# Patient Record
Sex: Female | Born: 1990 | Race: Black or African American | Hispanic: No | Marital: Single | Smoking: Former smoker
Health system: Southern US, Community
[De-identification: ages and names within clinical notes are randomized; demographics above are authoritative.]

## PROBLEM LIST (undated history)

## (undated) DIAGNOSIS — Z789 Other specified health status: Secondary | ICD-10-CM

## (undated) DIAGNOSIS — E119 Type 2 diabetes mellitus without complications: Secondary | ICD-10-CM

## (undated) DIAGNOSIS — D649 Anemia, unspecified: Secondary | ICD-10-CM

## (undated) HISTORY — DX: Type 2 diabetes mellitus without complications: E11.9

## (undated) HISTORY — PX: NO PAST SURGERIES: SHX2092

---
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
## 2011-02-28 LAB — POCT URINALYSIS DIP (DEVICE)
Hgb urine dipstick: NEGATIVE
Ketones, ur: NEGATIVE mg/dL
Ketones, ur: NEGATIVE mg/dL
Nitrite: NEGATIVE
Protein, ur: NEGATIVE mg/dL
Protein, ur: NEGATIVE mg/dL
Specific Gravity, Urine: <=1.005 (ref 1.005–1.030)
Urobilinogen, UA: 0.2 mg/dL (ref 0.0–1.0)
pH: 6 (ref 5.0–8.0)

## 2011-02-28 LAB — CBC
Hemoglobin: 11.4 g/dL — ABNORMAL LOW (ref 12.0–16.0)
MCHC: 33.6 g/dL (ref 31.0–37.0)
RDW: 14.7 % (ref 11.4–15.5)

## 2011-03-01 LAB — DIFFERENTIAL
Basophils Absolute: 0 10*3/uL (ref 0.0–0.1)
Lymphocytes Relative: 21 % — ABNORMAL LOW (ref 24–48)
Monocytes Relative: 10 % (ref 3–11)
Neutrophils Relative %: 68 % (ref 43–71)

## 2011-03-01 LAB — COMPREHENSIVE METABOLIC PANEL
ALT: 22 U/L (ref 0–35)
AST: 25 U/L (ref 0–37)
Albumin: 3 g/dL — ABNORMAL LOW (ref 3.5–5.2)
Alkaline Phosphatase: 139 U/L — ABNORMAL HIGH (ref 47–119)
BUN: 7 mg/dL (ref 6–23)
CO2: 23 mEq/L (ref 19–32)
Creatinine, Ser: 0.68 mg/dL (ref 0.4–1.2)
Sodium: 137 mEq/L (ref 135–145)
Total Bilirubin: 1.1 mg/dL (ref 0.3–1.2)

## 2011-03-01 LAB — STREP B DNA PROBE: Strep Group B Ag: NEGATIVE

## 2011-03-01 LAB — CBC
MCV: 90.2 fL (ref 78.0–98.0)
RBC: 3.75 MIL/uL — ABNORMAL LOW (ref 3.80–5.70)
RDW: 14.5 % (ref 11.4–15.5)
WBC: 7.4 10*3/uL (ref 4.5–13.5)

## 2011-03-01 LAB — WET PREP, GENITAL
Trich, Wet Prep: NONE SEEN
Yeast Wet Prep HPF POC: NONE SEEN

## 2011-03-01 LAB — HIV ANTIBODY (ROUTINE TESTING W REFLEX): HIV: NONREACTIVE

## 2012-08-22 ENCOUNTER — Observation Stay (HOSPITAL_COMMUNITY)
Admission: EM | Admit: 2012-08-22 | Discharge: 2012-08-23 | Disposition: A | Discharge status: Home / Self Care | Payer: Self-pay | Attending: Internal Medicine | Admitting: Internal Medicine

## 2012-08-22 ENCOUNTER — Emergency Department (HOSPITAL_COMMUNITY): Payer: Self-pay

## 2012-08-22 ENCOUNTER — Encounter (HOSPITAL_COMMUNITY): Payer: Self-pay | Admitting: Emergency Medicine

## 2012-08-22 DIAGNOSIS — Z720 Tobacco use: Secondary | ICD-10-CM

## 2012-08-22 DIAGNOSIS — R509 Fever, unspecified: Secondary | ICD-10-CM | POA: Insufficient documentation

## 2012-08-22 DIAGNOSIS — J209 Acute bronchitis, unspecified: Secondary | ICD-10-CM | POA: Insufficient documentation

## 2012-08-22 DIAGNOSIS — R0602 Shortness of breath: Principal | ICD-10-CM | POA: Insufficient documentation

## 2012-08-22 DIAGNOSIS — F17200 Nicotine dependence, unspecified, uncomplicated: Secondary | ICD-10-CM | POA: Insufficient documentation

## 2012-08-22 LAB — CBC WITH DIFFERENTIAL/PLATELET
Eosinophils Relative: 3 % (ref 0–5)
MCHC: 34.3 g/dL (ref 30.0–36.0)
MCV: 88.2 fL (ref 78.0–100.0)
Monocytes Absolute: 0.5 10*3/uL (ref 0.1–1.0)
Monocytes Relative: 5 % (ref 3–12)
Neutrophils Relative %: 80 % — ABNORMAL HIGH (ref 43–77)
Platelets: 306 10*3/uL (ref 150–400)
RBC: 4.5 MIL/uL (ref 3.87–5.11)
RDW: 12 % (ref 11.5–15.5)
WBC: 10.9 10*3/uL — ABNORMAL HIGH (ref 4.0–10.5)

## 2012-08-22 LAB — BASIC METABOLIC PANEL
BUN: 8 mg/dL (ref 6–23)
Calcium: 9.3 mg/dL (ref 8.4–10.5)
Creatinine, Ser: 0.71 mg/dL (ref 0.50–1.10)
Glucose, Bld: 130 mg/dL — ABNORMAL HIGH (ref 70–99)

## 2012-08-22 LAB — PREGNANCY, URINE: Preg Test, Ur: NEGATIVE

## 2012-08-22 MED ORDER — SODIUM CHLORIDE 0.9 % IV BOLUS (SEPSIS)
1000.0000 mL | Freq: Once | INTRAVENOUS | Status: AC
  Administered 2012-08-22: 1000 mL via INTRAVENOUS

## 2012-08-22 MED ORDER — ALBUTEROL SULFATE (5 MG/ML) 0.5% IN NEBU
5.0000 mg | INHALATION_SOLUTION | Freq: Once | RESPIRATORY_TRACT | Status: AC
  Administered 2012-08-22: 5 mg via RESPIRATORY_TRACT
  Filled 2012-08-22: qty 1

## 2012-08-22 MED ORDER — ONDANSETRON HCL 4 MG PO TABS: 4.0000 mg | ORAL_TABLET | Freq: Four times a day (QID) | ORAL | Status: DC | PRN

## 2012-08-22 MED ORDER — IPRATROPIUM BROMIDE 0.02 % IN SOLN
0.5000 mg | Freq: Once | RESPIRATORY_TRACT | Status: AC
  Administered 2012-08-22: 0.5 mg via RESPIRATORY_TRACT
  Filled 2012-08-22: qty 2.5

## 2012-08-22 MED ORDER — METHYLPREDNISOLONE SODIUM SUCC 125 MG IJ SOLR
125.0000 mg | Freq: Once | INTRAMUSCULAR | Status: AC
  Administered 2012-08-22: 125 mg via INTRAVENOUS
  Filled 2012-08-22: qty 2

## 2012-08-22 MED ORDER — ACETAMINOPHEN 325 MG PO TABS: 650.0000 mg | ORAL_TABLET | Freq: Four times a day (QID) | ORAL | Status: DC | PRN

## 2012-08-22 MED ORDER — SODIUM CHLORIDE 0.9 % IV SOLN
INTRAVENOUS | Status: DC
  Administered 2012-08-22 – 2012-08-23 (×2): via INTRAVENOUS

## 2012-08-22 MED ORDER — OXYCODONE HCL 5 MG PO TABS
5.0000 mg | ORAL_TABLET | ORAL | Status: DC | PRN
  Administered 2012-08-22 (×2): 5 mg via ORAL
  Filled 2012-08-22 (×2): qty 1

## 2012-08-22 MED ORDER — ACETAMINOPHEN 325 MG PO TABS
650.0000 mg | ORAL_TABLET | Freq: Once | ORAL | Status: AC
  Administered 2012-08-22: 650 mg via ORAL
  Filled 2012-08-22: qty 2

## 2012-08-22 MED ORDER — PREDNISONE 50 MG PO TABS
60.0000 mg | ORAL_TABLET | ORAL | Status: AC
  Administered 2012-08-22: 60 mg via ORAL
  Filled 2012-08-22: qty 1

## 2012-08-22 MED ORDER — ENOXAPARIN SODIUM 40 MG/0.4ML ~~LOC~~ SOLN
40.0000 mg | SUBCUTANEOUS | Status: DC
  Administered 2012-08-22: 40 mg via SUBCUTANEOUS
  Filled 2012-08-22 (×2): qty 0.4

## 2012-08-22 MED ORDER — ACETAMINOPHEN 650 MG RE SUPP: 650.0000 mg | Freq: Four times a day (QID) | RECTAL | Status: DC | PRN

## 2012-08-22 MED ORDER — ALBUTEROL SULFATE (5 MG/ML) 0.5% IN NEBU: 2.5000 mg | INHALATION_SOLUTION | RESPIRATORY_TRACT | Status: DC | PRN

## 2012-08-22 MED ORDER — SENNOSIDES-DOCUSATE SODIUM 8.6-50 MG PO TABS
1.0000 | ORAL_TABLET | Freq: Every evening | ORAL | Status: DC | PRN
  Filled 2012-08-22: qty 1

## 2012-08-22 MED ORDER — ALBUTEROL SULFATE (5 MG/ML) 0.5% IN NEBU
2.5000 mg | INHALATION_SOLUTION | Freq: Four times a day (QID) | RESPIRATORY_TRACT | Status: DC
  Administered 2012-08-22 – 2012-08-23 (×4): 2.5 mg via RESPIRATORY_TRACT
  Filled 2012-08-22 (×4): qty 0.5

## 2012-08-22 MED ORDER — LEVOFLOXACIN IN D5W 500 MG/100ML IV SOLN
500.0000 mg | INTRAVENOUS | Status: DC
  Administered 2012-08-22: 500 mg via INTRAVENOUS
  Filled 2012-08-22 (×2): qty 100

## 2012-08-22 MED ORDER — ONDANSETRON HCL 4 MG/2ML IJ SOLN: 4.0000 mg | Freq: Four times a day (QID) | INTRAMUSCULAR | Status: DC | PRN

## 2012-08-22 MED ORDER — ALBUTEROL (5 MG/ML) CONTINUOUS INHALATION SOLN
10.0000 mg/h | INHALATION_SOLUTION | Freq: Once | RESPIRATORY_TRACT | Status: AC
  Administered 2012-08-22: 10 mg/h via RESPIRATORY_TRACT

## 2012-08-22 MED ORDER — PNEUMOCOCCAL VAC POLYVALENT 25 MCG/0.5ML IJ INJ: 0.5000 mL | INJECTION | INTRAMUSCULAR | Status: DC

## 2012-08-22 NOTE — ED Notes (Signed)
Pt c/o upper chest and back pain with shob and cough onset yesterday, febrile in triage

## 2012-08-22 NOTE — H&P (Signed)
[facility]          History and Physical    PCP:   No primary provider on file.   Chief Complaint:  SOB, wheezing.  HPI: Pleasant 21 y/o young lady without significant PMH other than tobacco abuse, who presents to the hospital with a 1 day h/o SOB and wheezing. Had been having URI symptoms (runny nose, low grade fever) for a few days. Went to work and developed SOB and some mild anterior wall chest pains. Because these persisted, she came to the hospital today. She was found to have a temp of 102.7 and wheezing that did not resolve with neb treatments in the ED, hence we have been asked to admit her for further evaluation and management. Says she smokes only while at work because "it stresses me out". She only works weekends, so she states a pack a cigarettes will last her 2 weeks.  Allergies:  No Known Allergies   History reviewed. No pertinent past medical history.  History reviewed. No pertinent past surgical history.  Prior to Admission medications   Medication Sig Start Date End Date Taking? Authorizing Provider  etonogestrel (IMPLANON) 68 MG IMPL implant Inject 1 each into the skin once.   Yes Historical Provider, MD  Pseudoephedrine-APAP-DM (DAYQUIL PO) Take 2 tablets by mouth every 6 (six) hours as needed. COLD SYMPTOMS   Yes Historical Provider, MD    Social History:  reports that she has been smoking.  She does not have any smokeless tobacco history on file. She reports that she does not drink alcohol or use illicit drugs.  No family history on file.  Review of Systems:  Constitutional: Denies  Diaphoresis. HEENT: Denies photophobia, eye pain, redness, hearing loss, ear pain, congestion, sore throat, rhinorrhea, sneezing, mouth sores, trouble swallowing, neck pain, neck stiffness and tinnitus.   Cardiovascular: Denies  palpitations and leg swelling.  Gastrointestinal: Denies nausea, vomiting, abdominal pain, diarrhea, constipation, blood in stool and  abdominal distention.  Genitourinary: Denies dysuria, urgency, frequency, hematuria, flank pain and difficulty urinating.  Musculoskeletal: Denies myalgias, back pain, joint swelling, arthralgias and gait problem.  Skin: Denies pallor, rash and wound.  Neurological: Denies dizziness, seizures, syncope, weakness, light-headedness, numbness and headaches.  Hematological: Denies adenopathy. Easy bruising, personal or family bleeding history  Psychiatric/Behavioral: Denies suicidal ideation, mood changes, confusion, nervousness, sleep disturbance and agitation   Physical Exam: Blood pressure 110/70, pulse 125, temperature 98.2 F (36.8 C), temperature source Oral, resp. rate 21, height 5\' 6"  (1.676 m), weight 95.2 kg (209 lb 14.1 oz), last menstrual period 08/10/2012, SpO2 100.00%. Gen: AAOx3, NAD HEENT: [place]/AT/PERRL/EOMI Neck: supple, no JVD, no LAD, no bruits, no goiter. CV: Tachycardic, regular, no M/R/G. Lungs: CTA B Abd: S/NT/ND/+BS/no masses or organomegaly noted. Ext: no C/C/E, +pedal pulses. Neuro: grossly intact and non-focal. Skin: no rashes, wounds or skin breakdown identified on exam. Labs on Admission:  Results for orders placed during the hospital encounter of 08/22/12 (from the past 48 hour(s))  CBC WITH DIFFERENTIAL     Status: Abnormal   Collection Time   08/22/12  3:45 AM      Component Value Range Comment   WBC 10.9 (*) 4.0 - 10.5 K/uL    RBC 4.50  3.87 - 5.11 MIL/uL    Hemoglobin 13.6  12.0 - 15.0 g/dL    HCT 16.1  09.6 - 04.5 %    MCV 88.2  78.0 - 100.0 fL    MCH 30.2  26.0 - 34.0 pg  MCHC 34.3  30.0 - 36.0 g/dL    RDW 16.1  09.6 - 04.5 %    Platelets 306  150 - 400 K/uL    Neutrophils Relative 80 (*) 43 - 77 %    Lymphocytes Relative 12  12 - 46 %    Monocytes Relative 5  3 - 12 %    Eosinophils Relative 3  0 - 5 %    Basophils Relative 0  0 - 1 %    Neutro Abs 8.8 (*) 1.7 - 7.7 K/uL    Lymphs Abs 1.3  0.7 - 4.0 K/uL    Monocytes Absolute 0.5  0.1 - 1.0  K/uL    Eosinophils Absolute 0.3  0.0 - 0.7 K/uL    Basophils Absolute 0.0  0.0 - 0.1 K/uL    WBC Morphology VACUOLATED NEUTROPHILS   DOHLE BODIES  BASIC METABOLIC PANEL     Status: Abnormal   Collection Time   08/22/12  3:45 AM      Component Value Range Comment   Sodium 136  135 - 145 mEq/L    Potassium 4.1  3.5 - 5.1 mEq/L    Chloride 101  96 - 112 mEq/L    CO2 24  19 - 32 mEq/L    Glucose, Bld 130 (*) 70 - 99 mg/dL    BUN 8  6 - 23 mg/dL    Creatinine, Ser 4.09  0.50 - 1.10 mg/dL    Calcium 9.3  8.4 - 81.1 mg/dL    GFR calc non Af Amer >90  >90 mL/min    GFR calc Af Amer >90  >90 mL/min   PREGNANCY, URINE     Status: Normal   Collection Time   08/22/12  4:08 AM      Component Value Range Comment   Preg Test, Ur NEGATIVE  NEGATIVE     Radiological Exams on Admission: Dg Chest 2 View  08/22/2012  *RADIOLOGY REPORT*  Clinical Data: Occasional smoker, cough  CHEST - 2 VIEW  Comparison: None.  Findings: Normal mediastinum and cardiac silhouette.  Normal pulmonary  vasculature.  No evidence of effusion, infiltrate, or pneumothorax.  No acute bony abnormality.  IMPRESSION: No acute cardiopulmonary process.   Original Report Authenticated By: Genevive Bi, M.D.     Assessment/Plan Principal Problem:  *Bronchitis, acute, with bronchospasm Active Problems:  Fever  Tobacco abuse   Acute Bronchitis with Bronchospasm -Has a fever with cough and wheezing. -No infiltrate noted on CXR. -Temp of 102.7 on admission. -Start on levaquin/albuterol/prednisone taper.  Tobacco Abuse -Smoking cessation counseling provided. -Does not want a nicotine patch. -"I can quit on my own".  DVT Prophylaxis -Lovenox.  Disposition -Plan for potential DC home in am as long as symptomatically improved, not hypoxic and afebrile.  Time Spent on Admission: 55 minutes  HERNANDEZ ACOSTA,ESTELA [facility] Pager: [phone] 08/22/2012, 10:14 AM

## 2012-08-22 NOTE — ED Notes (Signed)
Patient is alert and oriented x3.  She has a states her pain is 8 of 10. She does have an expiratory wheeze.  She denies having this issue before.

## 2012-08-22 NOTE — ED Provider Notes (Signed)
Medical screening examination/treatment/procedure(s) were performed by non-physician practitioner and as supervising physician I was immediately available for consultation/collaboration.   Ivylynn Hoppes, MD 08/22/12 0713 

## 2012-08-22 NOTE — ED Provider Notes (Signed)
History     CSN: [id number]  Arrival date & time 08/22/12  0018   First MD Initiated Contact with Patient 08/22/12 [phone]      Chief Complaint  Patient presents with  . Shortness of Breath   HPI  History provided by the patient. Patient is a 21 year old female who is a current smoker presents with complaints of fever, cough and shortness of breath symptoms. Patient reports having generalized fatigue and not feeling well yesterday which was followed by some fever and warmth. Patient also began developing a productive cough, chest tightness and shortness of breath symptoms. Patient did try taking some DayQuil without any relief of symptoms. Patient reports significantly increased shortness of breath symptoms tonight which is made it hard to rest or sleep at all. Patient denies having similar symptoms previously. She denies any recent travel. She denies any sick contacts.   History reviewed. No pertinent past medical history.  History reviewed. No pertinent past surgical history.  No family history on file.  History  Substance Use Topics  . Smoking status: Current Every Day Smoker  . Smokeless tobacco: Not on file  . Alcohol Use: No    OB History    Grav Para Term Preterm Abortions TAB SAB Ect Mult Living                  Review of Systems  Constitutional: Positive for fever, chills and fatigue.  HENT: Positive for rhinorrhea. Negative for congestion and sore throat.   Respiratory: Positive for cough, shortness of breath and wheezing.   Cardiovascular: Negative for chest pain.  Gastrointestinal: Negative for nausea, vomiting, abdominal pain and diarrhea.  Genitourinary: Negative for dysuria, frequency, hematuria and flank pain.  Skin: Negative for rash.  Neurological: Positive for headaches. Negative for light-headedness.    Allergies  Review of patient's allergies indicates no known allergies.  Home Medications   Current Outpatient Rx  Name Route Sig Dispense Refill    . ETONOGESTREL 68 MG  IMPL Subcutaneous Inject 1 each into the skin once.    . DAYQUIL PO Oral Take 2 tablets by mouth every 6 (six) hours as needed. COLD SYMPTOMS      BP 116/68  Pulse 121  Temp 102.8 F (39.3 C) (Oral)  Resp 26  Ht 5\' 6"  (1.676 m)  Wt 210 lb (95.255 kg)  BMI 33.89 kg/m2  SpO2 97%  LMP 08/10/2012  Physical Exam  Nursing note and vitals reviewed. Constitutional: She is oriented to person, place, and time. She appears well-developed and well-nourished. No distress.  HENT:  Head: Normocephalic.  Neck: Normal range of motion. Neck supple.       No meningeal signs  Cardiovascular: Regular rhythm.  Tachycardia present.   Pulmonary/Chest: Tachypnea noted. She has wheezes. She has no rales. She exhibits no tenderness.  Abdominal: Soft. There is no tenderness. There is no rebound and no guarding.       Obese  Neurological: She is alert and oriented to person, place, and time.  Skin: Skin is warm and dry. No rash noted.  Psychiatric: She has a normal mood and affect. Her behavior is normal.    ED Course  Procedures   Results for orders placed during the hospital encounter of 08/22/12  CBC WITH DIFFERENTIAL      Component Value Range   WBC PENDING  4.0 - 10.5 K/uL   RBC 4.50  3.87 - 5.11 MIL/uL   Hemoglobin 13.6  12.0 - 15.0 g/dL   HCT  39.7  36.0 - 46.0 %   MCV 88.2  78.0 - 100.0 fL   MCH 30.2  26.0 - 34.0 pg   MCHC 34.3  30.0 - 36.0 g/dL   RDW 13.0  86.5 - 78.4 %   Platelets PENDING  150 - 400 K/uL   Neutrophils Relative PENDING  43 - 77 %   Neutro Abs PENDING  1.7 - 7.7 K/uL   Band Neutrophils PENDING  0 - 10 %   Lymphocytes Relative PENDING  12 - 46 %   Lymphs Abs PENDING  0.7 - 4.0 K/uL   Monocytes Relative PENDING  3 - 12 %   Monocytes Absolute PENDING  0.1 - 1.0 K/uL   Eosinophils Relative PENDING  0 - 5 %   Eosinophils Absolute PENDING  0.0 - 0.7 K/uL   Basophils Relative PENDING  0 - 1 %   Basophils Absolute PENDING  0.0 - 0.1 K/uL   WBC  Morphology PENDING     RBC Morphology PENDING     Smear Review PENDING     nRBC PENDING  0 /100 WBC   Metamyelocytes Relative PENDING     Myelocytes PENDING     Promyelocytes Absolute PENDING     Blasts PENDING    BASIC METABOLIC PANEL      Component Value Range   Sodium 136  135 - 145 mEq/L   Potassium 4.1  3.5 - 5.1 mEq/L   Chloride 101  96 - 112 mEq/L   CO2 24  19 - 32 mEq/L   Glucose, Bld 130 (*) 70 - 99 mg/dL   BUN 8  6 - 23 mg/dL   Creatinine, Ser 6.96  0.50 - 1.10 mg/dL   Calcium 9.3  8.4 - 29.5 mg/dL   GFR calc non Af Amer >90  >90 mL/min   GFR calc Af Amer >90  >90 mL/min  PREGNANCY, URINE      Component Value Range   Preg Test, Ur NEGATIVE  NEGATIVE      Dg Chest 2 View  08/22/2012  *RADIOLOGY REPORT*  Clinical Data: Occasional smoker, cough  CHEST - 2 VIEW  Comparison: None.  Findings: Normal mediastinum and cardiac silhouette.  Normal pulmonary  vasculature.  No evidence of effusion, infiltrate, or pneumothorax.  No acute bony abnormality.  IMPRESSION: No acute cardiopulmonary process.   Original Report Authenticated By: Genevive Bi, M.D.      1. Bronchitis with bronchospasm       MDM  Patient seen and evaluated. Patient currently tachycardic with slight tachypnea. Significant diffuse wheezing throughout. Chest x-ray unremarkable.   Patient reports feeling slightly better after our long neb treatment. She still has significant wheezing on exam. Heart rate is still tachycardic. Solu-Medrol also given.  Patient given additional breathing treatments.  Patient discussed with attending physician. Patient continues to have significant wheezing. We'll plan for admission.   [facility]. They'll see patient and admit.      Angus Seller, PA 08/22/12 0630

## 2012-08-23 ENCOUNTER — Other Ambulatory Visit (HOSPITAL_COMMUNITY): Payer: Self-pay | Admitting: Respiratory Therapy

## 2012-08-23 MED ORDER — ALBUTEROL SULFATE HFA 108 (90 BASE) MCG/ACT IN AERS: 2.0000 | INHALATION_SPRAY | Freq: Four times a day (QID) | RESPIRATORY_TRACT | Status: DC | PRN

## 2012-08-23 MED ORDER — PREDNISONE 10 MG PO KIT: PACK | ORAL | Status: DC

## 2012-08-23 MED ORDER — ACETAMINOPHEN 325 MG PO TABS: 650.0000 mg | ORAL_TABLET | Freq: Four times a day (QID) | ORAL | Status: DC | PRN

## 2012-08-23 MED ORDER — LEVOFLOXACIN 500 MG PO TABS: 500.0000 mg | ORAL_TABLET | Freq: Every day | ORAL | Status: DC

## 2012-08-23 NOTE — Progress Notes (Signed)
Discharge instructions given to pt, verbalized understanding. Left the unit in stable condition. 

## 2012-08-23 NOTE — Discharge Summary (Signed)
Physician Discharge Summary  Patient ID: [name] [name] MRN: [id number] DOB/AGE: December 05, 1990 21 y.o.  Admit date: 08/22/2012 Discharge date: 08/23/2012  Primary Care Physician:  No primary provider on file.   Discharge Diagnoses:    Principal Problem:  *Bronchitis, acute, with bronchospasm Active Problems:  Fever  Tobacco abuse      Medication List     As of 08/23/2012 12:44 PM    STOP taking these medications         DAYQUIL PO      TAKE these medications         acetaminophen 325 MG tablet   Commonly known as: TYLENOL   Take 2 tablets (650 mg total) by mouth every 6 (six) hours as needed (or Fever >/= 101).      albuterol 108 (90 BASE) MCG/ACT inhaler   Commonly known as: PROVENTIL HFA;VENTOLIN HFA   Inhale 2 puffs into the lungs every 6 (six) hours as needed for wheezing.      IMPLANON 68 MG Impl implant   Generic drug: etonogestrel   Inject 1 each into the skin once.      levofloxacin 500 MG tablet   Commonly known as: LEVAQUIN   Take 1 tablet (500 mg total) by mouth daily.      PredniSONE 10 MG Kit   Start taking 6 pills today and decrease by 1 pill daily until no more are left in the pack.         Disposition and Follow-up:  Will be discharged home today in stable and improved condition.   Consults:  None   Significant Diagnostic Studies:  Dg Chest 2 View  08/22/2012  *RADIOLOGY REPORT*  Clinical Data: Occasional smoker, cough  CHEST - 2 VIEW  Comparison: None.  Findings: Normal mediastinum and cardiac silhouette.  Normal pulmonary  vasculature.  No evidence of effusion, infiltrate, or pneumothorax.  No acute bony abnormality.  IMPRESSION: No acute cardiopulmonary process.   Original Report Authenticated By: Genevive Bi, M.D.     Brief H and P: For complete details please refer to admission H and P, but in brief patient is a pleasant 21 y/o young lady without significant PMH other than tobacco abuse, who presents to the hospital with a 1 day h/o  SOB and wheezing. Had been having URI symptoms (runny nose, low grade fever) for a few days. Went to work and developed SOB and some mild anterior wall chest pains. Because these persisted, she came to the hospital today. She was found to have a temp of 102.7 and wheezing that did not resolve with neb treatments in the ED, hence we have been asked to admit her for further evaluation and management. Says she smokes only while at work because "it stresses me out". She only works weekends, so she states a pack a cigarettes will last her 2 weeks.     Hospital Course:  Principal Problem:  *Bronchitis, acute, with bronchospasm Active Problems:  Fever  Tobacco abuse    Acute Bronchitis with Bronchospasm -CXR without indication for PNA. -Improved today. No wheezing on chest auscultation. -Has been afebrile for >24 hours. -Will discharge home on a 7 day course of levaquin, prednisone taper and PRN albuterol inhaler. -She has been advised on smoking cessation and says she does not need the aid of patches to quit.   Time spent on Discharge: Greater than 30 minutes.  SignedChaya Jan [facility] Pager: [phone] 08/23/2012, 12:44 PM

## 2013-01-08 ENCOUNTER — Other Ambulatory Visit: Payer: Self-pay

## 2013-09-29 ENCOUNTER — Other Ambulatory Visit: Payer: Self-pay

## 2014-07-25 ENCOUNTER — Encounter (HOSPITAL_COMMUNITY): Payer: Self-pay | Admitting: Emergency Medicine

## 2014-07-25 ENCOUNTER — Emergency Department (HOSPITAL_COMMUNITY)
Admission: EM | Admit: 2014-07-25 | Discharge: 2014-07-26 | Disposition: A | Discharge status: Home / Self Care | Payer: Medicaid Other | Attending: Emergency Medicine | Admitting: Emergency Medicine

## 2014-07-25 ENCOUNTER — Emergency Department (HOSPITAL_COMMUNITY): Payer: Medicaid Other

## 2014-07-25 DIAGNOSIS — R112 Nausea with vomiting, unspecified: Secondary | ICD-10-CM | POA: Insufficient documentation

## 2014-07-25 DIAGNOSIS — Z79899 Other long term (current) drug therapy: Secondary | ICD-10-CM | POA: Diagnosis not present

## 2014-07-25 DIAGNOSIS — M549 Dorsalgia, unspecified: Secondary | ICD-10-CM | POA: Insufficient documentation

## 2014-07-25 DIAGNOSIS — F17200 Nicotine dependence, unspecified, uncomplicated: Secondary | ICD-10-CM | POA: Insufficient documentation

## 2014-07-25 DIAGNOSIS — N898 Other specified noninflammatory disorders of vagina: Secondary | ICD-10-CM | POA: Diagnosis present

## 2014-07-25 DIAGNOSIS — Z3202 Encounter for pregnancy test, result negative: Secondary | ICD-10-CM | POA: Diagnosis not present

## 2014-07-25 DIAGNOSIS — B9689 Other specified bacterial agents as the cause of diseases classified elsewhere: Secondary | ICD-10-CM

## 2014-07-25 DIAGNOSIS — N760 Acute vaginitis: Secondary | ICD-10-CM | POA: Insufficient documentation

## 2014-07-25 DIAGNOSIS — A499 Bacterial infection, unspecified: Secondary | ICD-10-CM | POA: Insufficient documentation

## 2014-07-25 DIAGNOSIS — A599 Trichomoniasis, unspecified: Secondary | ICD-10-CM

## 2014-07-25 DIAGNOSIS — A5901 Trichomonal vulvovaginitis: Secondary | ICD-10-CM | POA: Diagnosis not present

## 2014-07-25 LAB — URINALYSIS, ROUTINE W REFLEX MICROSCOPIC
BILIRUBIN URINE: NEGATIVE
Glucose, UA: NEGATIVE mg/dL
KETONES UR: NEGATIVE mg/dL
NITRITE: NEGATIVE
PROTEIN: NEGATIVE mg/dL
Specific Gravity, Urine: 1.013 (ref 1.005–1.030)
UROBILINOGEN UA: 1 mg/dL (ref 0.0–1.0)
pH: 6 (ref 5.0–8.0)

## 2014-07-25 LAB — WET PREP, GENITAL: YEAST WET PREP: NONE SEEN

## 2014-07-25 LAB — BASIC METABOLIC PANEL
ANION GAP: 15 (ref 5–15)
BUN: 7 mg/dL (ref 6–23)
CHLORIDE: 96 meq/L (ref 96–112)
CO2: 24 meq/L (ref 19–32)
CREATININE: 0.88 mg/dL (ref 0.50–1.10)
Calcium: 8.3 mg/dL — ABNORMAL LOW (ref 8.4–10.5)
GFR calc Af Amer: >90 mL/min (ref >90)
GFR calc non Af Amer: >90 mL/min (ref >90)
Glucose, Bld: 100 mg/dL — ABNORMAL HIGH (ref 70–99)
Potassium: 3.2 mEq/L — ABNORMAL LOW (ref 3.7–5.3)
Sodium: 135 mEq/L — ABNORMAL LOW (ref 137–147)

## 2014-07-25 LAB — CBC WITH DIFFERENTIAL/PLATELET
BASOS ABS: 0 10*3/uL (ref 0.0–0.1)
Basophils Relative: 0 % (ref 0–1)
Eosinophils Absolute: 0 10*3/uL (ref 0.0–0.7)
Eosinophils Relative: 0 % (ref 0–5)
HEMATOCRIT: 38.8 % (ref 36.0–46.0)
HEMOGLOBIN: 13 g/dL (ref 12.0–15.0)
LYMPHS ABS: 1.1 10*3/uL (ref 0.7–4.0)
LYMPHS PCT: 24 % (ref 12–46)
MCH: 30.7 pg (ref 26.0–34.0)
MCHC: 33.5 g/dL (ref 30.0–36.0)
MCV: 91.5 fL (ref 78.0–100.0)
MONO ABS: 0.3 10*3/uL (ref 0.1–1.0)
Monocytes Relative: 7 % (ref 3–12)
NEUTROS ABS: 3.2 10*3/uL (ref 1.7–7.7)
Neutrophils Relative %: 69 % (ref 43–77)
Platelets: 163 10*3/uL (ref 150–400)
RBC: 4.24 MIL/uL (ref 3.87–5.11)
RDW: 11.6 % (ref 11.5–15.5)
WBC: 4.6 10*3/uL (ref 4.0–10.5)

## 2014-07-25 LAB — URINE MICROSCOPIC-ADD ON

## 2014-07-25 LAB — POC URINE PREG, ED: Preg Test, Ur: NEGATIVE

## 2014-07-25 MED ORDER — METRONIDAZOLE 500 MG PO TABS: 500.0000 mg | ORAL_TABLET | Freq: Two times a day (BID) | ORAL | Status: DC

## 2014-07-25 MED ORDER — ONDANSETRON HCL 4 MG/2ML IJ SOLN
4.0000 mg | Freq: Once | INTRAMUSCULAR | Status: AC
Start: 2014-07-25 — End: 2014-07-25
  Administered 2014-07-25: 4 mg via INTRAVENOUS
  Filled 2014-07-25: qty 2

## 2014-07-25 MED ORDER — SODIUM CHLORIDE 0.9 % IV BOLUS (SEPSIS)
1000.0000 mL | Freq: Once | INTRAVENOUS | Status: AC
  Administered 2014-07-25: 1000 mL via INTRAVENOUS

## 2014-07-25 MED ORDER — DEXTROSE 5 % IV SOLN
1.0000 g | Freq: Once | INTRAVENOUS | Status: AC
  Administered 2014-07-25: 1 g via INTRAVENOUS
  Filled 2014-07-25: qty 10

## 2014-07-25 MED ORDER — AZITHROMYCIN 250 MG PO TABS
1000.0000 mg | ORAL_TABLET | Freq: Once | ORAL | Status: AC
  Administered 2014-07-25: 1000 mg via ORAL
  Filled 2014-07-25: qty 4

## 2014-07-25 MED ORDER — MORPHINE SULFATE 4 MG/ML IJ SOLN
4.0000 mg | Freq: Once | INTRAMUSCULAR | Status: AC
  Administered 2014-07-25: 4 mg via INTRAVENOUS
  Filled 2014-07-25: qty 1

## 2014-07-25 MED ORDER — ACETAMINOPHEN 325 MG PO TABS
650.0000 mg | ORAL_TABLET | Freq: Once | ORAL | Status: AC
  Administered 2014-07-25: 650 mg via ORAL
  Filled 2014-07-25: qty 2

## 2014-07-25 MED ORDER — ONDANSETRON 4 MG PO TBDP: 4.0000 mg | ORAL_TABLET | Freq: Three times a day (TID) | ORAL | Status: DC | PRN

## 2014-07-25 MED ORDER — TRAMADOL HCL 50 MG PO TABS: 50.0000 mg | ORAL_TABLET | Freq: Four times a day (QID) | ORAL | Status: DC | PRN

## 2014-07-25 MED ORDER — IBUPROFEN 800 MG PO TABS
800.0000 mg | ORAL_TABLET | Freq: Once | ORAL | Status: AC
  Administered 2014-07-25: 800 mg via ORAL
  Filled 2014-07-25: qty 1

## 2014-07-25 MED ORDER — DOXYCYCLINE HYCLATE 100 MG PO CAPS: 100.0000 mg | ORAL_CAPSULE | Freq: Two times a day (BID) | ORAL | Status: DC

## 2014-07-25 NOTE — ED Notes (Addendum)
Pt presents with mid back pain worse when straining for a BM onset 4 days ago- pt reports generalized body aches and headaches as well, denies changes in vision.  Admits to N/V, denies diarrhea.  Pt reports vaginal discharge for the past 3 days white in color, denies pain with urination.

## 2014-07-25 NOTE — Discharge Instructions (Signed)
Please follow up with an Ob/Gyn to schedule a follow up appointment. Please take your antibiotic until completion. Please take pain medication and/or muscle relaxants as prescribed and as needed for pain. Please do not drive on narcotic pain medication or on muscle relaxants. Please read all discharge instructions and return precautions.    Bacterial Vaginosis Bacterial vaginosis is a vaginal infection that occurs when the normal balance of bacteria in the vagina is disrupted. It results from an overgrowth of certain bacteria. This is the most common vaginal infection in women of childbearing age. Treatment is important to prevent complications, especially in pregnant women, as it can cause a premature delivery. CAUSES  Bacterial vaginosis is caused by an increase in harmful bacteria that are normally present in smaller amounts in the vagina. Several different kinds of bacteria can cause bacterial vaginosis. However, the reason that the condition develops is not fully understood. RISK FACTORS Certain activities or behaviors can put you at an increased risk of developing bacterial vaginosis, including:  Having a new sex partner or multiple sex partners.  Douching.  Using an intrauterine device (IUD) for contraception. Women do not get bacterial vaginosis from toilet seats, bedding, swimming pools, or contact with objects around them. SIGNS AND SYMPTOMS  Some women with bacterial vaginosis have no signs or symptoms. Common symptoms include:  Grey vaginal discharge.  A fishlike odor with discharge, especially after sexual intercourse.  Itching or burning of the vagina and vulva.  Burning or pain with urination. DIAGNOSIS  Your health care provider will take a medical history and examine the vagina for signs of bacterial vaginosis. A sample of vaginal fluid may be taken. Your health care provider will look at this sample under a microscope to check for bacteria and abnormal cells. A vaginal pH  test may also be done.  TREATMENT  Bacterial vaginosis may be treated with antibiotic medicines. These may be given in the form of a pill or a vaginal cream. A second round of antibiotics may be prescribed if the condition comes back after treatment.  HOME CARE INSTRUCTIONS   Only take over-the-counter or prescription medicines as directed by your health care provider.  If antibiotic medicine was prescribed, take it as directed. Make sure you finish it even if you start to feel better.  Do not have sex until treatment is completed.  Tell all sexual partners that you have a vaginal infection. They should see their health care provider and be treated if they have problems, such as a mild rash or itching.  Practice safe sex by using condoms and only having one sex partner. SEEK MEDICAL CARE IF:   Your symptoms are not improving after 3 days of treatment.  You have increased discharge or pain.  You have a fever. MAKE SURE YOU:   Understand these instructions.  Will watch your condition.  Will get help right away if you are not doing well or get worse. FOR MORE INFORMATION  Centers for Disease Control and Prevention, Division of STD Prevention: SolutionApps.co.za American Sexual Health Association (ASHA): www.ashastd.org  Document Released: 11/10/2005 Document Revised: 08/31/2013 Document Reviewed: 06/22/2013 [facility] Patient Information 2015 [place], [place]. This information is not intended to replace advice given to you by your health care provider. Make sure you discuss any questions you have with your health care provider. Trichomoniasis Trichomoniasis is an infection caused by an organism called Trichomonas. The infection can affect both women and men. In women, the outer female genitalia and the vagina are affected.  In men, the penis is mainly affected, but the prostate and other reproductive organs can also be involved. Trichomoniasis is a sexually transmitted infection (STI) and is  most often passed to another person through sexual contact.  RISK FACTORS  Having unprotected sexual intercourse.  Having sexual intercourse with an infected partner. SIGNS AND SYMPTOMS  Symptoms of trichomoniasis in women include:  Abnormal gray-green frothy vaginal discharge.  Itching and irritation of the vagina.  Itching and irritation of the area outside the vagina. Symptoms of trichomoniasis in men include:   Penile discharge with or without pain.  Pain during urination. This results from inflammation of the urethra. DIAGNOSIS  Trichomoniasis may be found during a Pap test or physical exam. Your health care provider may use one of the following methods to help diagnose this infection:  Examining vaginal discharge under a microscope. For men, urethral discharge would be examined.  Testing the pH of the vagina with a test tape.  Using a vaginal swab test that checks for the Trichomonas organism. A test is available that provides results within a few minutes.  Doing a culture test for the organism. This is not usually needed. TREATMENT   You may be given medicine to fight the infection. Women should inform their health care provider if they could be or are pregnant. Some medicines used to treat the infection should not be taken during pregnancy.  Your health care provider may recommend over-the-counter medicines or creams to decrease itching or irritation.  Your sexual partner will need to be treated if infected. HOME CARE INSTRUCTIONS   Take medicines only as directed by your health care provider.  Take over-the-counter medicine for itching or irritation as directed by your health care provider.  Do not have sexual intercourse while you have the infection.  Women should not douche or wear tampons while they have the infection.  Discuss your infection with your partner. Your partner may have gotten the infection from you, or you may have gotten it from your  partner.  Have your sex partner get examined and treated if necessary.  Practice safe, informed, and protected sex.  See your health care provider for other STI testing. SEEK MEDICAL CARE IF:   You still have symptoms after you finish your medicine.  You develop abdominal pain.  You have pain when you urinate.  You have bleeding after sexual intercourse.  You develop a rash.  Your medicine makes you sick or makes you throw up (vomit). MAKE SURE YOU:  Understand these instructions.  Will watch your condition.  Will get help right away if you are not doing well or get worse. Document Released: 05/06/2001 Document Revised: 03/27/2014 Document Reviewed: 08/22/2013 [facility] Patient Information 2015 [place], [place]. This information is not intended to replace advice given to you by your health care provider. Make sure you discuss any questions you have with your health care provider.

## 2014-07-25 NOTE — ED Provider Notes (Signed)
CSN: [id number]     Arrival date & time 07/25/14  1813 History   First MD Initiated Contact with Patient 07/25/14 1922     Chief Complaint  Patient presents with  . Back Pain  . Vaginal Discharge     (Consider location/radiation/quality/duration/timing/severity/associated sxs/prior Treatment) HPI Comments: Patient is a G61 P45 23 year old female past medical history significant for tobacco use presenting to the emergency department for 3 day history of lower abdominal pain with radiation to back with associated nausea, nonbloody nonbilious emesis x2, white vaginal discharge, fever. Patient does endorse recent unprotected sexual intercourse. Denies any urinary symptoms, diarrhea, constipation. No abdominal surgical history. LMP last week.   Patient is a 23 y.o. female presenting with back pain and vaginal discharge.  Back Pain Associated symptoms: abdominal pain and fever   Vaginal Discharge Associated symptoms: abdominal pain, fever, nausea and vomiting     History reviewed. No pertinent past medical history. History reviewed. No pertinent past surgical history. No family history on file. History  Substance Use Topics  . Smoking status: Current Every Day Smoker  . Smokeless tobacco: Not on file  . Alcohol Use: Yes   OB History   Grav Para Term Preterm Abortions TAB SAB Ect Mult Living                 Review of Systems  Constitutional: Positive for fever and chills.  Gastrointestinal: Positive for nausea, vomiting and abdominal pain. Negative for diarrhea and constipation.  Genitourinary: Positive for vaginal discharge.  Musculoskeletal: Positive for back pain.      Allergies  Review of patient's allergies indicates no known allergies.  Home Medications   Prior to Admission medications   Medication Sig Start Date End Date Taking? Authorizing Provider  albuterol (PROVENTIL HFA;VENTOLIN HFA) 108 (90 BASE) MCG/ACT inhaler Inhale 2 puffs into the lungs every 6 (six) hours as  needed for wheezing or shortness of breath.   Yes Historical Provider, MD  etonogestrel (IMPLANON) 68 MG IMPL implant Inject 1 each into the skin once.   Yes Historical Provider, MD  doxycycline (VIBRAMYCIN) 100 MG capsule Take 1 capsule (100 mg total) by mouth 2 (two) times daily. 07/25/14   Layli Capshaw L Jaicee Michelotti, PA-C  metroNIDAZOLE (FLAGYL) 500 MG tablet Take 1 tablet (500 mg total) by mouth 2 (two) times daily. 07/25/14   Raymel Cull L Latorsha Curling, PA-C  ondansetron (ZOFRAN ODT) 4 MG disintegrating tablet Take 1 tablet (4 mg total) by mouth every 8 (eight) hours as needed for nausea or vomiting. 07/25/14   Esmay Amspacher L Larone Kliethermes, PA-C  traMADol (ULTRAM) 50 MG tablet Take 1 tablet (50 mg total) by mouth every 6 (six) hours as needed. 07/25/14   Yanissa Michalsky L Haille Pardi, PA-C   BP 120/64  Pulse 83  Temp(Src) 98.5 F (36.9 C) (Oral)  Resp 18  SpO2 94%  LMP 07/02/2014 Physical Exam  Nursing note and vitals reviewed. Constitutional: She is oriented to person, place, and time. She appears well-developed and well-nourished. No distress.  HENT:  Head: Normocephalic and atraumatic.  Right Ear: External ear normal.  Left Ear: External ear normal.  Nose: Nose normal.  Mouth/Throat: Oropharynx is clear and moist.  Eyes: Conjunctivae are normal.  Neck: Normal range of motion. Neck supple.  Cardiovascular: Normal rate, regular rhythm and normal heart sounds.   Pulmonary/Chest: Effort normal and breath sounds normal. No respiratory distress.  Abdominal: Soft. Normal appearance and bowel sounds are normal. There is no tenderness. There is no rigidity, no rebound, no guarding  and no CVA tenderness.  Musculoskeletal: Normal range of motion.  Neurological: She is alert and oriented to person, place, and time.  Skin: Skin is warm and dry. She is not diaphoretic.  Psychiatric: She has a normal mood and affect.   Exam performed by Francee Piccolo L,  exam chaperoned Date: 07/25/2014 Pelvic exam: normal  external genitalia without evidence of trauma. VULVA: normal appearing vulva with no masses, tenderness or lesion. VAGINA: normal appearing vagina with normal color and discharge, no lesions. CERVIX: normal appearing cervix without lesions, cervical motion tenderness absent, cervical os closed with out purulent discharge; vaginal discharge - green and malodorous, Wet prep and DNA probe for chlamydia and GC obtained.   ADNEXA: normal adnexa in size, nontender and no masses UTERUS: uterus is normal size, shape, consistency and nontender.   ED Course  Procedures (including critical care time) Medications  acetaminophen (TYLENOL) tablet 650 mg (650 mg Oral Given 07/25/14 1825)  sodium chloride 0.9 % bolus 1,000 mL (1,000 mLs Intravenous New Bag/Given 07/25/14 1942)  ibuprofen (ADVIL,MOTRIN) tablet 800 mg (800 mg Oral Given 07/25/14 2013)  morphine 4 MG/ML injection 4 mg (4 mg Intravenous Given 07/25/14 2016)  ondansetron (ZOFRAN) injection 4 mg (4 mg Intravenous Given 07/25/14 2014)  cefTRIAXone (ROCEPHIN) 1 g in dextrose 5 % 50 mL IVPB (1 g Intravenous New Bag/Given 07/25/14 2238)  azithromycin (ZITHROMAX) tablet 1,000 mg (1,000 mg Oral Given 07/25/14 2236)    Labs Review Labs Reviewed  WET PREP, GENITAL - Abnormal; Notable for the following:    Trich, Wet Prep FEW (*)    Clue Cells Wet Prep HPF POC FEW (*)    WBC, Wet Prep HPF POC FEW (*)    All other components within normal limits  URINALYSIS, ROUTINE W REFLEX MICROSCOPIC - Abnormal; Notable for the following:    Color, Urine AMBER (*)    APPearance HAZY (*)    Hgb urine dipstick SMALL (*)    Leukocytes, UA TRACE (*)    All other components within normal limits  URINE MICROSCOPIC-ADD ON - Abnormal; Notable for the following:    Squamous Epithelial / LPF FEW (*)    All other components within normal limits  BASIC METABOLIC PANEL - Abnormal; Notable for the following:    Sodium 135 (*)    Potassium 3.2 (*)    Glucose, Bld 100 (*)    Calcium  8.3 (*)    All other components within normal limits  GC/CHLAMYDIA PROBE AMP  URINE CULTURE  CBC WITH DIFFERENTIAL  HIV ANTIBODY (ROUTINE TESTING)  RPR  POC URINE PREG, ED    Imaging Review US Transvaginal Non-ob  07/25/2014   CLINICAL DATA:  Back pain with vaginal discharge  EXAM: TRANSABDOMINAL AND TRANSVAGINAL ULTRASOUND OF PELVIS  DOPPLER ULTRASOUND OF OVARIES  TECHNIQUE: Both transabdominal and transvaginal ultrasound examinations of the pelvis were performed. Transabdominal technique was performed for global imaging of the pelvis including uterus, ovaries, adnexal regions, and pelvic cul-de-sac.  It was necessary to proceed with endovaginal exam following the transabdominal exam to visualize the endometrium and ovaries. Color and duplex Doppler ultrasound was utilized to evaluate blood flow to the ovaries.  COMPARISON:  None.  FINDINGS: Uterus  Measurements: 10.1 x 5.3 x 5 cm. No fibroids or other mass visualized.  Endometrium  Thickness: 4.7 mm.  No focal abnormality visualized.  Right ovary  Measurements: 4.4 x 1.8 x 2.5 cm. Normal appearance/no adnexal mass.  Left ovary  Measurements: 4.2 x 3.1 x 3.2 cm.  There is a subtle area of heterogeneously increased echogenicity within the left ovary which measures 2 x 1.6 x 1.9 cm. It is not hypervascular.  Pulsed Doppler evaluation of both ovaries demonstrates normal low-resistance arterial and venous waveforms.  Other findings  No free fluid.  IMPRESSION: 1. Within the left ovary there is an area of slightly increased echotexture that may reflect a hemorrhagic cyst but it is an indeterminate finding. There are no findings to suggest left ovarian torsion or tubo-ovarian abscess. Follow-up ultrasound in 6-12 weeks preferably in the week following the patient's last menstrual period is recommended. 2. The right ovary and the uterus are normal in appearance.   Electronically Signed   By: David  [place]   On: 07/25/2014 21:10   US Pelvis Complete  07/25/2014    CLINICAL DATA:  Back pain with vaginal discharge  EXAM: TRANSABDOMINAL AND TRANSVAGINAL ULTRASOUND OF PELVIS  DOPPLER ULTRASOUND OF OVARIES  TECHNIQUE: Both transabdominal and transvaginal ultrasound examinations of the pelvis were performed. Transabdominal technique was performed for global imaging of the pelvis including uterus, ovaries, adnexal regions, and pelvic cul-de-sac.  It was necessary to proceed with endovaginal exam following the transabdominal exam to visualize the endometrium and ovaries. Color and duplex Doppler ultrasound was utilized to evaluate blood flow to the ovaries.  COMPARISON:  None.  FINDINGS: Uterus  Measurements: 10.1 x 5.3 x 5 cm. No fibroids or other mass visualized.  Endometrium  Thickness: 4.7 mm.  No focal abnormality visualized.  Right ovary  Measurements: 4.4 x 1.8 x 2.5 cm. Normal appearance/no adnexal mass.  Left ovary  Measurements: 4.2 x 3.1 x 3.2 cm. There is a subtle area of heterogeneously increased echogenicity within the left ovary which measures 2 x 1.6 x 1.9 cm. It is not hypervascular.  Pulsed Doppler evaluation of both ovaries demonstrates normal low-resistance arterial and venous waveforms.  Other findings  No free fluid.  IMPRESSION: 1. Within the left ovary there is an area of slightly increased echotexture that may reflect a hemorrhagic cyst but it is an indeterminate finding. There are no findings to suggest left ovarian torsion or tubo-ovarian abscess. Follow-up ultrasound in 6-12 weeks preferably in the week following the patient's last menstrual period is recommended. 2. The right ovary and the uterus are normal in appearance.   Electronically Signed   By: David  [place]   On: 07/25/2014 21:10   [place] Art/ven Flow Abd Pelv Doppler  07/25/2014   CLINICAL DATA:  Back pain with vaginal discharge  EXAM: TRANSABDOMINAL AND TRANSVAGINAL ULTRASOUND OF PELVIS  DOPPLER ULTRASOUND OF OVARIES  TECHNIQUE: Both transabdominal and transvaginal ultrasound examinations of the  pelvis were performed. Transabdominal technique was performed for global imaging of the pelvis including uterus, ovaries, adnexal regions, and pelvic cul-de-sac.  It was necessary to proceed with endovaginal exam following the transabdominal exam to visualize the endometrium and ovaries. Color and duplex Doppler ultrasound was utilized to evaluate blood flow to the ovaries.  COMPARISON:  None.  FINDINGS: Uterus  Measurements: 10.1 x 5.3 x 5 cm. No fibroids or other mass visualized.  Endometrium  Thickness: 4.7 mm.  No focal abnormality visualized.  Right ovary  Measurements: 4.4 x 1.8 x 2.5 cm. Normal appearance/no adnexal mass.  Left ovary  Measurements: 4.2 x 3.1 x 3.2 cm. There is a subtle area of heterogeneously increased echogenicity within the left ovary which measures 2 x 1.6 x 1.9 cm. It is not hypervascular.  Pulsed Doppler evaluation of both ovaries demonstrates normal low-resistance  arterial and venous waveforms.  Other findings  No free fluid.  IMPRESSION: 1. Within the left ovary there is an area of slightly increased echotexture that may reflect a hemorrhagic cyst but it is an indeterminate finding. There are no findings to suggest left ovarian torsion or tubo-ovarian abscess. Follow-up ultrasound in 6-12 weeks preferably in the week following the patient's last menstrual period is recommended. 2. The right ovary and the uterus are normal in appearance.   Electronically Signed   By: David  [place]   On: 07/25/2014 21:10     EKG Interpretation None      MDM   Final diagnoses:  Bacterial vaginosis  Trichomonas infection    Filed Vitals:   07/25/14 2330  BP: 120/64  Pulse: 83  Temp:   Resp:     Patient presenting with fever to ED. Pt alert, active, and oriented per age. PE showed abdomen, soft, non-tender, non-distended. No peritoneal signs. No CMT or adnexal fullness or tenderness. No meningeal signs. Pt tolerating PO liquids in ED without difficulty. Motrin and Tylenol given and  improvement of fever. US unremarkable. Labs reviewed. Urine culture sent. Wet prep reveals Trich, BV, and WBCs. Will prophylactically treat with Azithromycin and Rocephin. Advised patient that GC/Chlamydia results will take 48-72 hours to resolve, she should refrain from sexual intercourse until then, if a positive test result is known she should abstain for 10 days to ensure antibiotics work. Given fever wth mild tachycardia with vaginal discharge no evidence of other source of fever will send patient home with Flagyl and doxycycline in case of early PID. Advised OB GYN followup. Return precautions were discussed with patient who is agreeable to plan. Patient is stable at time of discharge. Patient d/w with Dr. Jeraldine Loots, agrees with plan.       Jeannetta Ellis, PA-C 07/26/14 0037

## 2014-07-26 LAB — GC/CHLAMYDIA PROBE AMP
CT Probe RNA: POSITIVE — AB
GC Probe RNA: POSITIVE — AB

## 2014-07-26 LAB — HIV ANTIBODY (ROUTINE TESTING W REFLEX): HIV: NONREACTIVE

## 2014-07-26 LAB — RPR

## 2014-07-26 NOTE — ED Provider Notes (Signed)
  Medical screening examination/treatment/procedure(s) were performed by non-physician practitioner and as supervising physician I was immediately available for consultation/collaboration.   EKG Interpretation None         Dorsie Sethi, MD 07/26/14 0042 

## 2014-07-27 ENCOUNTER — Telehealth (HOSPITAL_BASED_OUTPATIENT_CLINIC_OR_DEPARTMENT_OTHER): Payer: Self-pay | Admitting: Emergency Medicine

## 2014-07-27 LAB — URINE CULTURE: Colony Count: 70000

## 2014-07-27 NOTE — Telephone Encounter (Signed)
Post ED Visit - Positive Culture Follow-up  Culture report reviewed by antimicrobial stewardship pharmacist:  Wes Dulaney, Pharm.D., BCPS  Celedonio Miyamoto, Pharm.D., BCPS  Georgina Pillion, Pharm.D., BCPS  [place], [place].D., BCPS, AAHIVP  Estella Husk, Pharm.D., BCPS, AAHIVP  Carly Sabat, Pharm.D.  Enzo Bi, Pharm.D.  Positive chlamydia. gonorrhea culture Treated with Azithromycin 1 gram po, Rocephin 250 1 gram IV, organism sensitive to the same and no further patient follow-up is required at this time.  Berle Mull 07/27/2014, 8:24 AM

## 2014-08-30 NOTE — Progress Notes (Signed)
CM received call from patient stating that she was unable to fill her discharge antibiotics from 07/25/14 ED visit because she did not have her Medicaid card. She stated that she just received her Medicaid card in the mail and wants to know if she can receive new copies of her ED prescriptions. This CM explained that the antibiotics prescribed over a month ago may no longer be appropriate at this time for treatment. The patient confirmed that she has a provider listed on her Medicaid and stated that is is [facility]. This CM recommended that she establish care with her listed provider and make an appointment for appropriate treatment at this time. Then explained that if they could not see her in a timely manner and she still felt symptoms to go to an urgent care for treatment until she can be seen by her assigned PCP. The patient verbalized understanding and had no other questions or concerns.

## 2016-02-06 IMAGING — US US TRANSVAGINAL NON-OB
1 series · 13 of 25 positions shown · non-contrast
Comparison: None.

CLINICAL DATA: Back pain with vaginal discharge

EXAM:
TRANSABDOMINAL AND TRANSVAGINAL ULTRASOUND OF PELVIS
DOPPLER ULTRASOUND OF OVARIES
TECHNIQUE: Both transabdominal and transvaginal ultrasound examinations of the
pelvis were performed. Transabdominal technique was performed for
global imaging of the pelvis including uterus, ovaries, adnexal
regions, and pelvic cul-de-sac.
It was necessary to proceed with endovaginal exam following the
transabdominal exam to visualize the endometrium and ovaries. Color
and duplex Doppler ultrasound was utilized to evaluate blood flow to
the ovaries.

[Series 1: us transvaginal non-ob · 0.24mm/px · 13 of 75 slices shown]
[im 1/75]
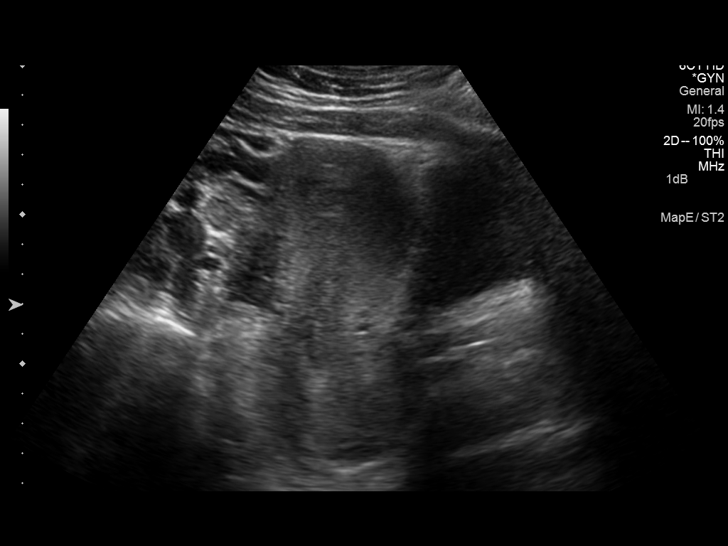
[im 7/75]
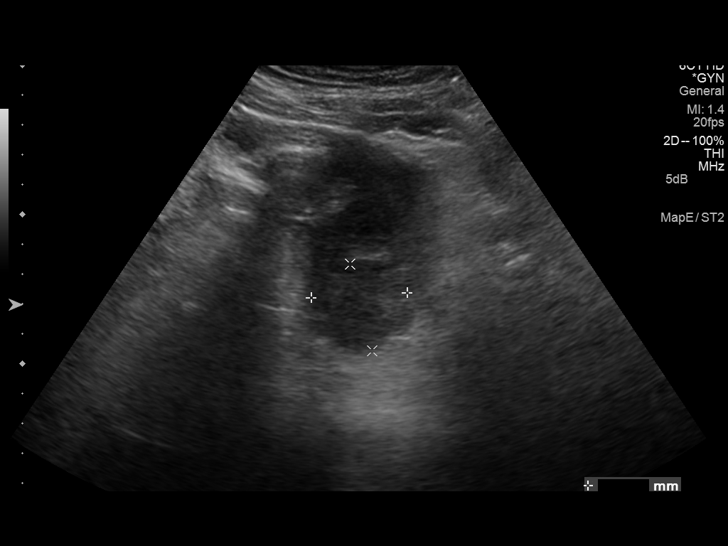
[im 13/75]
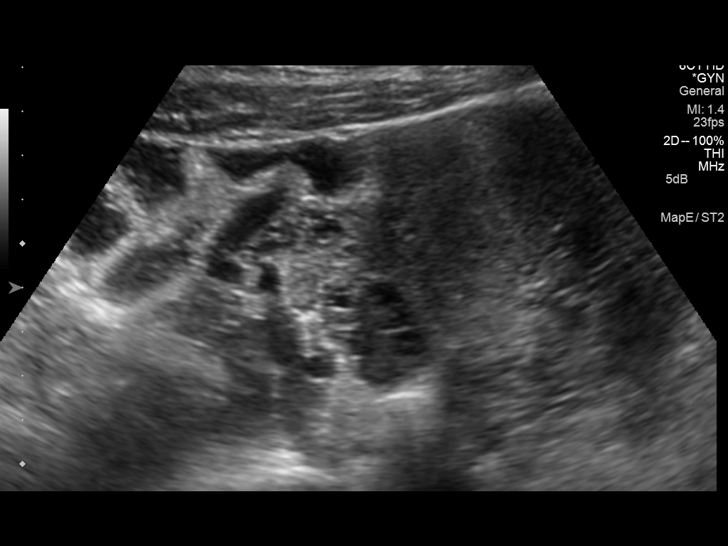
[im 19/75]
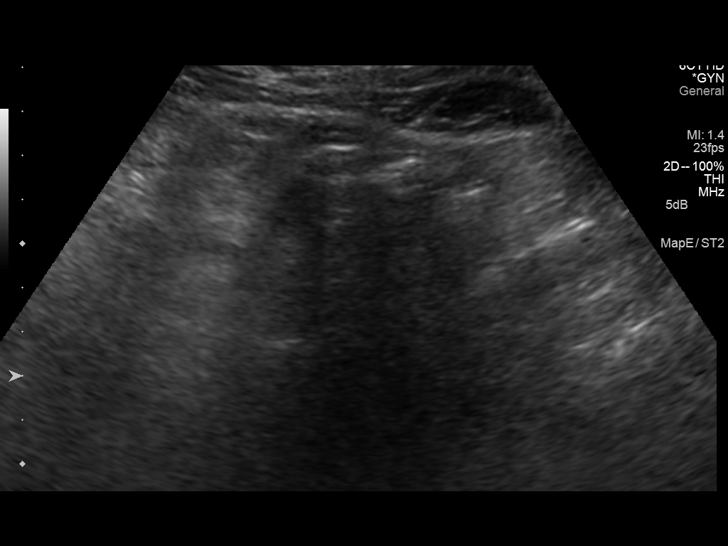
[im 25/75]
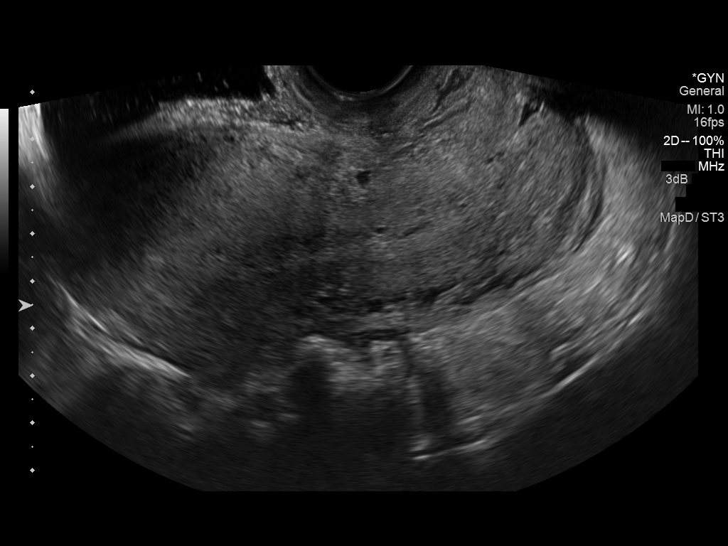
[im 31/75]
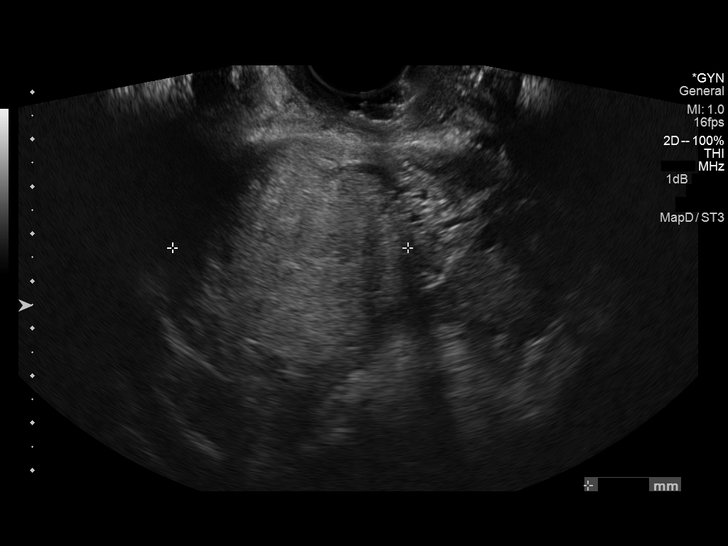
[im 38/75]
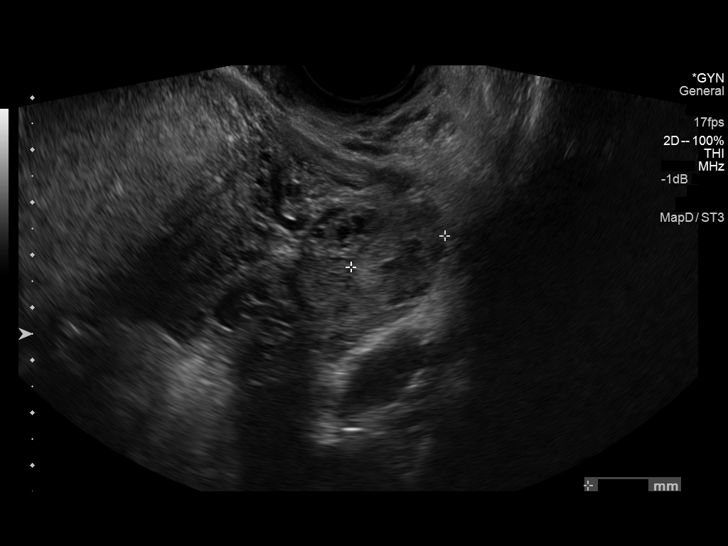
[im 44/75]
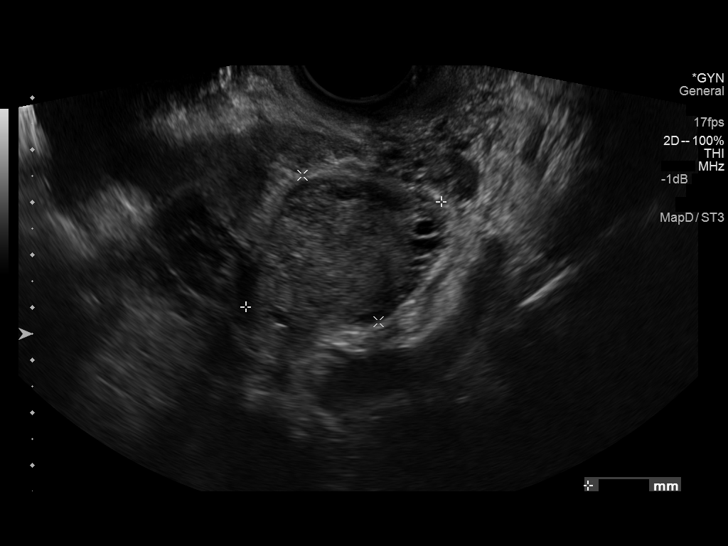
[im 50/75]
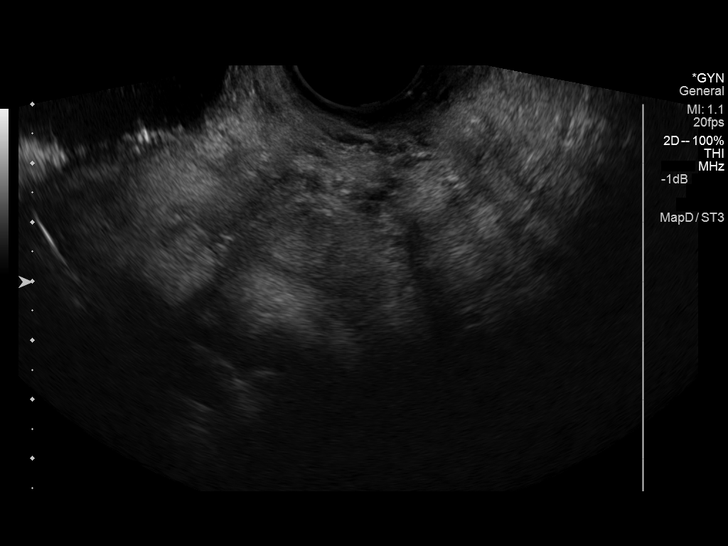
[im 56/75]
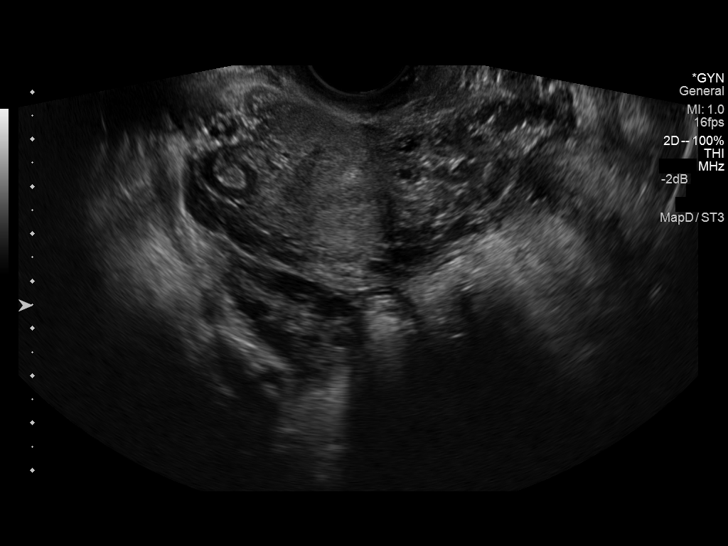
[im 62/75]
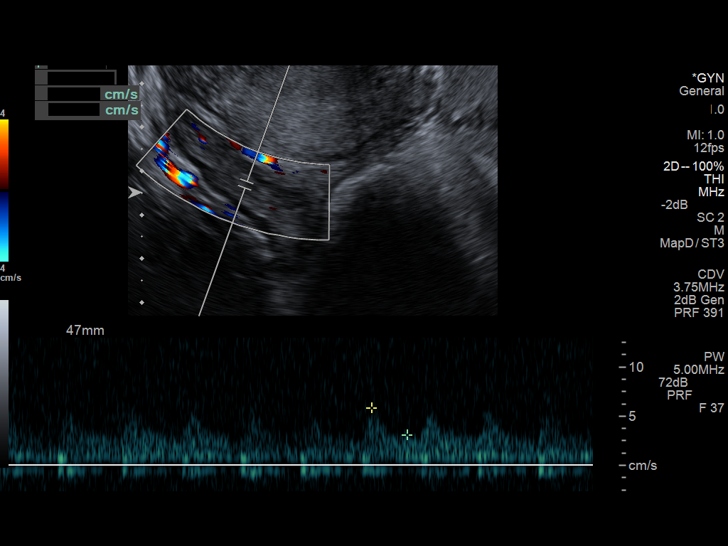
[im 68/75]
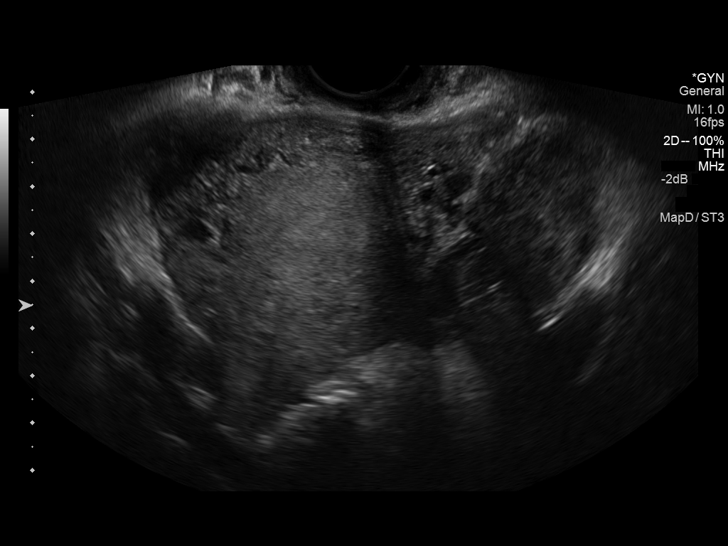
[im 75/75]
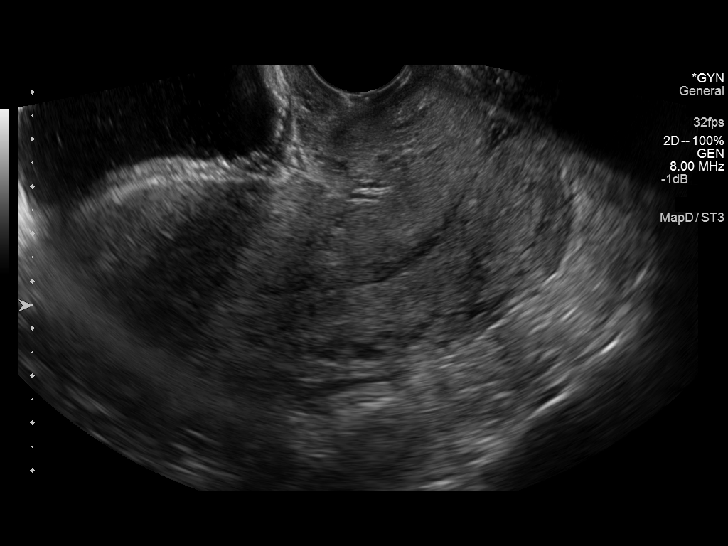

[13 of 25 positions shown; findings below may reference images not displayed]

FINDINGS: Uterus

Measurements: 10.1 x 5.3 x 5 cm. No fibroids or other mass
visualized.

Endometrium

Thickness: 4.7 mm.  No focal abnormality visualized.

Right ovary

Measurements: 4.4 x 1.8 x 2.5 cm. Normal appearance/no adnexal mass.

Left ovary

Measurements: 4.2 x 3.1 x 3.2 cm.. There is a subtle area of
heterogeneously increased echogenicity within the left ovary which
measures 2 x 1.6 x 1.9 cm. It is not hypervascular.

Pulsed Doppler evaluation of both ovaries demonstrates normal
low-resistance arterial and venous waveforms.

Other findings

No free fluid.
IMPRESSION: 1. Within the left ovary there is an area of slightly increased
echotexture that may reflect a hemorrhagic cyst but it is an
indeterminate finding. There are no findings to suggest left ovarian
torsion or tubo-ovarian abscess. Follow-up ultrasound in 6-12 weeks
preferably in the week following the patient's last menstrual period
is recommended.
2. The right ovary and the uterus are normal in appearance.

## 2018-04-18 ENCOUNTER — Emergency Department (HOSPITAL_COMMUNITY)
Admission: EM | Admit: 2018-04-18 | Discharge: 2018-04-18 | Disposition: A | Discharge status: Home / Self Care | Payer: Medicaid Other | Attending: Emergency Medicine | Admitting: Emergency Medicine

## 2018-04-18 ENCOUNTER — Encounter (HOSPITAL_COMMUNITY): Payer: Self-pay | Admitting: Emergency Medicine

## 2018-04-18 ENCOUNTER — Emergency Department (HOSPITAL_COMMUNITY): Payer: Medicaid Other

## 2018-04-18 DIAGNOSIS — R0981 Nasal congestion: Secondary | ICD-10-CM | POA: Insufficient documentation

## 2018-04-18 DIAGNOSIS — Z79899 Other long term (current) drug therapy: Secondary | ICD-10-CM | POA: Insufficient documentation

## 2018-04-18 DIAGNOSIS — F17210 Nicotine dependence, cigarettes, uncomplicated: Secondary | ICD-10-CM | POA: Diagnosis not present

## 2018-04-18 DIAGNOSIS — R05 Cough: Secondary | ICD-10-CM | POA: Diagnosis present

## 2018-04-18 DIAGNOSIS — J069 Acute upper respiratory infection, unspecified: Secondary | ICD-10-CM | POA: Diagnosis not present

## 2018-04-18 MED ORDER — ALBUTEROL SULFATE (2.5 MG/3ML) 0.083% IN NEBU
5.0000 mg | INHALATION_SOLUTION | Freq: Once | RESPIRATORY_TRACT | Status: AC
  Administered 2018-04-18: 5 mg via RESPIRATORY_TRACT
  Filled 2018-04-18: qty 6

## 2018-04-18 MED ORDER — DEXAMETHASONE 4 MG PO TABS
10.0000 mg | ORAL_TABLET | Freq: Once | ORAL | Status: DC
  Filled 2018-04-18: qty 2

## 2018-04-18 MED ORDER — ALBUTEROL SULFATE HFA 108 (90 BASE) MCG/ACT IN AERS: 2.0000 | INHALATION_SPRAY | Freq: Four times a day (QID) | RESPIRATORY_TRACT | 0 refills | Status: DC | PRN

## 2018-04-18 NOTE — ED Triage Notes (Signed)
Pt c/o congestion, productive cough, chills since Thursday. Reports torso hurts when she coughs or takes deep breaths.

## 2018-04-18 NOTE — ED Provider Notes (Signed)
[facility]-EMERGENCY DEPT Provider Note   CSN: [id number] Arrival date & time: 04/18/18  0749     History   Chief Complaint Chief Complaint  Patient presents with  . Cough  . Nasal Congestion    HPI [name] [name] is a 27 y.o. female.  The history is provided by the patient. No language interpreter was used.  Cough    [name] [name] is a 27 y.o. female who presents to the Emergency Department complaining of cough, congestion. He presents to the emergency department for evaluation of four days of cough, congestion. Symptoms started Wednesday afternoon with frequent sneezing and nasal congestion. The next day she noticed a fever to 103 at home with chills, cough and shortness of breath. She also has some sharp left and right sided chest pain that is worse with breathing as well as episodes of coughing. She denies any nausea, vomiting, leg swelling or pain, abdominal pain. No no and is sick contacts. She does occasionally smoke. She does not take any oral contraceptives. No history of asthma. History reviewed. No pertinent past medical history.  Patient Active Problem List   Diagnosis Date Noted  . Bronchitis, acute, with bronchospasm 08/22/2012  . Fever 08/22/2012  . Tobacco abuse 08/22/2012    History reviewed. No pertinent surgical history.   OB History   None      Home Medications    Prior to Admission medications   Medication Sig Start Date End Date Taking? Authorizing Provider  albuterol (PROVENTIL HFA;VENTOLIN HFA) 108 (90 Base) MCG/ACT inhaler Inhale 2 puffs into the lungs every 6 (six) hours as needed for wheezing or shortness of breath. 04/18/18   Tilden Fossa, MD  doxycycline (VIBRAMYCIN) 100 MG capsule Take 1 capsule (100 mg total) by mouth 2 (two) times daily. 07/25/14   Piepenbrink, Victorino Dike, PA-C  etonogestrel (IMPLANON) 68 MG IMPL implant Inject 1 each into the skin once.    [provider]  metroNIDAZOLE (FLAGYL) 500 MG tablet  Take 1 tablet (500 mg total) by mouth 2 (two) times daily. 07/25/14   Piepenbrink, Victorino Dike, PA-C  ondansetron (ZOFRAN ODT) 4 MG disintegrating tablet Take 1 tablet (4 mg total) by mouth every 8 (eight) hours as needed for nausea or vomiting. 07/25/14   Piepenbrink, Victorino Dike, PA-C  traMADol (ULTRAM) 50 MG tablet Take 1 tablet (50 mg total) by mouth every 6 (six) hours as needed. 07/25/14   Piepenbrink, Victorino Dike, PA-C    Family History No family history on file.  Social History Social History   Tobacco Use  . Smoking status: Current Every Day Smoker    Types: Cigarettes  . Smokeless tobacco: Never Used  Substance Use Topics  . Alcohol use: Yes  . Drug use: No     Allergies   Patient has no known allergies.   Review of Systems Review of Systems  Respiratory: Positive for cough.   All other systems reviewed and are negative.  Symptoms are moderate and constant in nature.   Physical Exam Updated Vital Signs BP 124/67 (BP Location: Right Arm)   Pulse (!) 104   Temp 99.6 F (37.6 C) (Oral)   Resp 19   LMP 03/06/2018 Comment: waiver signed, states no chance of preg  SpO2 98%   Physical Exam  Constitutional: She is oriented to person, place, and time. She appears well-developed and well-nourished.  HENT:  Head: Normocephalic and atraumatic.  Cardiovascular: Regular rhythm.  No murmur heard. Tachycardic  Pulmonary/Chest: Effort normal. No respiratory distress.  Diffuse  wheezes and rhonchi  Abdominal: Soft. There is no tenderness. There is no rebound and no guarding.  Musculoskeletal: She exhibits no edema or tenderness.  Neurological: She is alert and oriented to person, place, and time.  Skin: Skin is warm and dry.  Psychiatric: She has a normal mood and affect. Her behavior is normal.  Nursing note and vitals reviewed.    ED Treatments / Results  Labs (all labs ordered are listed, but only abnormal results are displayed) Labs Reviewed - No data to  display  EKG None  Radiology Dg Chest 2 View  Result Date: 04/18/2018 CLINICAL DATA:  Cough, chest pain. EXAM: CHEST - 2 VIEW COMPARISON:  Radiographs of August 22, 2012. FINDINGS: The heart size and mediastinal contours are within normal limits. Both lungs are clear. No pneumothorax or pleural effusion is noted. The visualized skeletal structures are unremarkable. IMPRESSION: No active cardiopulmonary disease. Electronically Signed   By: Lupita Raider, M.D.   On: 04/18/2018 09:52    Procedures Procedures (including critical care time)  Medications Ordered in ED Medications  albuterol (PROVENTIL) (2.5 MG/3ML) 0.083% nebulizer solution 5 mg (5 mg Nebulization Given 04/18/18 1228)     Initial Impression / Assessment and Plan / ED Course  I have reviewed the triage vital signs and the nursing notes.  Pertinent labs & imaging results that were available during my care of the patient were reviewed by me and considered in my medical decision making (see chart for details).     Pt here with cough for three days.  She is nontoxic appearing on eval with no respiratory distress.  CXR without evidence of pna. Pt with wheezing on initial eval - resolved after albuterol.  She reports feeling improved following albuterol.  She has no hx/o asthma but has responded well to MDI when she has a virus.  Will treat with one time dose of decadron for possible reactive airway disease, albuterol MDI for home.  Counseled patient on home care, outpatient follow up, return precautions.    Presentation is not c/w PE, pna, CHF.    Final Clinical Impressions(s) / ED Diagnoses   Final diagnoses:  Acute upper respiratory infection    ED Discharge Orders        Ordered    albuterol (PROVENTIL HFA;VENTOLIN HFA) 108 (90 Base) MCG/ACT inhaler  Every 6 hours PRN     04/18/18 1243       Tilden Fossa, MD 04/18/18 1739

## 2019-11-25 NOTE — L&D Delivery Note (Signed)
Delivery Note   Patient Name: [name] [name] DOB: [birth year] MRN: [id number]  Date of admission: 09/12/2020 Delivering MD: Dale Menasha  Date of delivery: 09/13/20 Type of delivery: SVD  Newborn Data: Live born female  Birth Weight: 5 lb 4.3 oz (2390 g) APGAR: 5, 7  Newborn Delivery   Birth date/time: 09/12/2020 15:14:00 Delivery type: Vaginal, Spontaneous    [name] [name], 29 y.o., @ [redacted]w[redacted]d,  G3P1011, who was admitted for PPROM at 0900 on 10/20, was on mag for tocolysis, BMZ x1 given, mag turned off pt was complete and feeling pressure, DM2 will continue on glyburide 5mg  Po BID and metformin 500mg  PO BID, fasting and 2HPP will be checked, GBS was negative, pt did receive IV clindamycin prior to delivery,.. I was called to the room when she progressed 2+ station in the second stage of labor.  She pushed for 5/min.  She delivered a viable infant, cephalic and restituted to the LOA position over an intact perineum.  A nuchal cord   was not identified. The baby was placed on maternal abdomen while initial step of NRP were perfmored (Dry, Stimulated, and warmed). Hat placed on baby for thermoregulation. Delayed cord clamping was performed for 1 minutes.  Cord double clamped and cut.  Cord cut by father. Apgar scores were 5 and 7. Prophylactic Pitocin was started in the third stage of labor for active management. The placenta delivered spontaneously, shultz, with a 3 vessel cord and was sent to pathology.  Inspection revealed none. An examination of the vaginal vault and cervix was free from lacerations. The uterus was firm, bleeding stable. Umbilical artery blood gas were not sent.  There were no complications during the procedure.  Mom and baby skin to skin following delivery. Left in stable condition.  Maternal Info: Anesthesia: Epidural Episiotomy: No Lacerations:  No Suture Repair: No Est. Blood Loss (mL):   Newborn Info:  Baby Sex: female Circumcision: N/A Babies Name:  Amanie APGAR (1 MIN): 5   APGAR (5 MINS): 7   APGAR (10 MINS): 8     Mom to postpartum.  Baby to Couplet care / Skin to Skin.  DR Dillard aware  [facility], CNM, NP-C 09/13/20 4:02pm

## 2020-03-07 ENCOUNTER — Encounter (HOSPITAL_COMMUNITY): Payer: Self-pay | Admitting: *Deleted

## 2020-03-07 ENCOUNTER — Other Ambulatory Visit: Payer: Self-pay

## 2020-03-07 ENCOUNTER — Emergency Department (HOSPITAL_COMMUNITY)
Admission: EM | Admit: 2020-03-07 | Discharge: 2020-03-07 | Disposition: A | Discharge status: Home / Self Care | Payer: Medicaid Other | Attending: Emergency Medicine | Admitting: Emergency Medicine

## 2020-03-07 DIAGNOSIS — F17210 Nicotine dependence, cigarettes, uncomplicated: Secondary | ICD-10-CM | POA: Diagnosis not present

## 2020-03-07 DIAGNOSIS — A599 Trichomoniasis, unspecified: Secondary | ICD-10-CM

## 2020-03-07 DIAGNOSIS — Z79899 Other long term (current) drug therapy: Secondary | ICD-10-CM | POA: Insufficient documentation

## 2020-03-07 DIAGNOSIS — N898 Other specified noninflammatory disorders of vagina: Secondary | ICD-10-CM | POA: Diagnosis present

## 2020-03-07 DIAGNOSIS — A5901 Trichomonal vulvovaginitis: Secondary | ICD-10-CM | POA: Diagnosis not present

## 2020-03-07 LAB — URINALYSIS, ROUTINE W REFLEX MICROSCOPIC
Bacteria, UA: NONE SEEN
Bilirubin Urine: NEGATIVE
Glucose, UA: >=500 mg/dL — AB
Hgb urine dipstick: NEGATIVE
Ketones, ur: 5 mg/dL — AB
Nitrite: NEGATIVE
Protein, ur: NEGATIVE mg/dL
Specific Gravity, Urine: 1.036 — ABNORMAL HIGH (ref 1.005–1.030)
pH: 5 (ref 5.0–8.0)

## 2020-03-07 LAB — WET PREP, GENITAL
Sperm: NONE SEEN
Yeast Wet Prep HPF POC: NONE SEEN

## 2020-03-07 LAB — PREGNANCY, URINE: Preg Test, Ur: POSITIVE — AB

## 2020-03-07 MED ORDER — METRONIDAZOLE 500 MG PO TABS
500.0000 mg | ORAL_TABLET | Freq: Once | ORAL | Status: AC
  Administered 2020-03-07: 500 mg via ORAL
  Filled 2020-03-07: qty 1

## 2020-03-07 NOTE — ED Triage Notes (Signed)
Pt says that she has labial swelling, "white clumpy" discharge and some itching. Pt says she is pregnant (LMP 2/15).

## 2020-03-07 NOTE — ED Provider Notes (Signed)
[facility] DEPT Provider Note   CSN: [id number] Arrival date & time: 03/07/20  0053     History Chief Complaint  Patient presents with   Vaginal Discharge    [name] [name] is a 29 y.o. female.  Patient in early pregnancy (reported LMP 2/15) presents emergency department with complaint of vaginal discomfort, itching, white clumpy discharge over the past 3 days.  She reports some irritation and swelling of the labia.  Patient states it feels like previous yeast infection.  No treatments prior to arrival.  She denies any vaginal bleeding, abdominal pain, pelvic pain, back pain.  No lightheadedness, syncope or shortness of breath.  No treatments prior to arrival.  She denies any dysuria.        No past medical history on file.  Patient Active Problem List   Diagnosis Date Noted   Bronchitis, acute, with bronchospasm 08/22/2012   Fever 08/22/2012   Tobacco abuse 08/22/2012    No past surgical history on file.   OB History    Gravida  1   Para      Term      Preterm      AB      Living        SAB      TAB      Ectopic      Multiple      Live Births              No family history on file.  Social History   Tobacco Use   Smoking status: Current Every Day Smoker    Types: Cigarettes   Smokeless tobacco: Never Used  Substance Use Topics   Alcohol use: Yes   Drug use: No    Home Medications Prior to Admission medications   Medication Sig Start Date End Date Taking? Authorizing Provider  albuterol (PROVENTIL HFA;VENTOLIN HFA) 108 (90 Base) MCG/ACT inhaler Inhale 2 puffs into the lungs every 6 (six) hours as needed for wheezing or shortness of breath. 04/18/18   Quintella Reichert, MD  doxycycline (VIBRAMYCIN) 100 MG capsule Take 1 capsule (100 mg total) by mouth 2 (two) times daily. 07/25/14   Piepenbrink, Anderson Malta, PA-C  etonogestrel (IMPLANON) 68 MG IMPL implant Inject 1 each into the skin once.    [provider]  metroNIDAZOLE (FLAGYL) 500 MG tablet Take 1 tablet (500 mg total) by mouth 2 (two) times daily. 07/25/14   Piepenbrink, Anderson Malta, PA-C  ondansetron (ZOFRAN ODT) 4 MG disintegrating tablet Take 1 tablet (4 mg total) by mouth every 8 (eight) hours as needed for nausea or vomiting. 07/25/14   Piepenbrink, Anderson Malta, PA-C  traMADol (ULTRAM) 50 MG tablet Take 1 tablet (50 mg total) by mouth every 6 (six) hours as needed. 07/25/14   Piepenbrink, Anderson Malta, PA-C    Allergies    Patient has no known allergies.  Review of Systems   Review of Systems  Constitutional: Negative for fever.  HENT: Negative for rhinorrhea and sore throat.   Eyes: Negative for redness.  Respiratory: Negative for cough.   Cardiovascular: Negative for chest pain.  Gastrointestinal: Negative for abdominal pain, diarrhea, nausea and vomiting.  Genitourinary: Positive for vaginal discharge and vaginal pain. Negative for decreased urine volume, dysuria, frequency, hematuria, pelvic pain and vaginal bleeding.  Musculoskeletal: Negative for myalgias.  Skin: Negative for rash.  Neurological: Negative for headaches.    Physical Exam Updated Vital Signs BP 138/90 (BP Location: Right Arm)    Pulse 86  Temp 98.2 F (36.8 C) (Oral)    Resp 15    Ht 5\' 6"  (1.676 m)    Wt 99.8 kg    LMP 01/09/2020    SpO2 98%    BMI 35.51 kg/m   Physical Exam Vitals and nursing note reviewed. Exam conducted with a chaperone present.  Constitutional:      Appearance: She is well-developed.  HENT:     Head: Normocephalic and atraumatic.  Eyes:     Conjunctiva/sclera: Conjunctivae normal.  Pulmonary:     Effort: No respiratory distress.  Abdominal:     Tenderness: There is no abdominal tenderness. There is no guarding or rebound.  Genitourinary:    Exam position: Lithotomy position.     Labia:        Right: No rash, tenderness or lesion.        Left: No rash, tenderness or lesion.      Vagina: Vaginal discharge (thick, white,  heterogeneous) present. No tenderness, bleeding or lesions.     Cervix: No discharge or cervical bleeding.     Uterus: Not tender.      Adnexa:        Right: No tenderness.         Left: No tenderness.    Musculoskeletal:     Cervical back: Normal range of motion and neck supple.  Skin:    General: Skin is warm and dry.  Neurological:     Mental Status: She is alert.     ED Results / Procedures / Treatments   Labs (all labs ordered are listed, but only abnormal results are displayed) Labs Reviewed  WET PREP, GENITAL - Abnormal; Notable for the following components:      Result Value   Trich, Wet Prep PRESENT (*)    Clue Cells Wet Prep HPF POC PRESENT (*)    WBC, Wet Prep HPF POC MANY (*)    All other components within normal limits  URINALYSIS, ROUTINE W REFLEX MICROSCOPIC - Abnormal; Notable for the following components:   Specific Gravity, Urine 1.036 (*)    Glucose, UA >=500 (*)    Ketones, ur 5 (*)    Leukocytes,Ua TRACE (*)    All other components within normal limits  PREGNANCY, URINE - Abnormal; Notable for the following components:   Preg Test, Ur POSITIVE (*)    All other components within normal limits  GC/CHLAMYDIA PROBE AMP ([place]) NOT AT [facility]    EKG None  Radiology No results found.  Procedures Procedures (including critical care time)  Medications Ordered in ED Medications  metroNIDAZOLE (FLAGYL) tablet 500 mg (has no administration in time range)    ED Course  I have reviewed the triage vital signs and the nursing notes.  Pertinent labs & imaging results that were available during my care of the patient were reviewed by me and considered in my medical decision making (see chart for details).  Patient seen and examined. Work-up initiated. Will perform pelvic exam. Awaiting urine pregnancy confirmation.   Vital signs reviewed and are as follows: BP 138/90 (BP Location: Right Arm)    Pulse 86    Temp 98.2 F (36.8 C) (Oral)    Resp 15     Ht 5\' 6"  (1.676 m)    Wt 99.8 kg    LMP 01/09/2020    SpO2 98%    BMI 35.51 kg/m   7:19 AM Pelvic exam performed with RN chaperone.   7:49 AM wet prep is positive  for trichomonas which is likely the etiology of the patient's symptoms and discharge.  Patient will be treated with metronidazole 500 mg by mouth twice a day for 1 week.  Reviewed recommendations for pregnant patients and up-to-date.  Discussed at length with patient that she is at higher risk for coinfection such as gonorrhea and chlamydia.  Discussed that this testing will return in 24 to 48 hours.  We agreed that since she is pregnant, will not treat empirically, however wait for results to occur.  If she is positive for either gonorrhea or chlamydia, she will need to contact her OB/GYN, go to the [facility], or return to the emergency department for treatment of these infections.  Patient will follow-up for routine prenatal care next month.  Patient counseled on safe sexual practices. Told them that they should not have sexual contact for next 7 days and that they need to inform sexual partners so that they can get tested and treated as well. Patient verbalizes understanding and agrees with plan.      MDM Rules/Calculators/A&P                      Patient with vaginal discharge and irritation.  She is in the first trimester of pregnancy, LMP January 09, 2020.  No bleeding.  No abdominal pain or tenderness.  No signs of threatened abortion.  Wet prep is positive for trichomonas.  Testing for gonorrhea and chlamydia pending.  Treatment plan as above.  Patient looks very well.  She has upcoming GYN appointment in 1 month to begin routine prenatal care.   Final Clinical Impression(s) / ED Diagnoses Final diagnoses:  Trichomoniasis    Rx / DC Orders ED Discharge Orders    None       Renne Crigler, PA-C 03/07/20 0754    Gerhard Munch, MD 03/07/20 1018

## 2020-03-07 NOTE — Discharge Instructions (Signed)
Please read and follow all provided instructions.  Your diagnoses today include:  1. Trichomoniasis     Tests performed today include:  Test for gonorrhea and chlamydia.   Wet prep - positive for trichomonas  Vital signs. See below for your results today.   Medications:  You also given metronidazole (pills) that treat for trichomonas. Please fill the prescription for metronidazole and take until completed.  Avoid alcohol when taking this medication as it can cause severe vomiting.  Home care instructions:  Read educational materials contained in this packet and follow any instructions provided.   As we discussed, it is very important that you follow-up on your test for gonorrhea and chlamydia.  These should return in the next 24 to 48 hours.  If either is positive, you will require treatment.  Consider contacting your OB/GYN, going to the [facility] through the [facility], ED for treatment or return to any emergency department.  You should tell your partners about your infection and avoid having sex for one week to allow time for the medicine to work.  Sexually transmitted disease testing also available at:   [facility] [place], [facility]  [address], [place], phone [phone] or [phone]    Monday - Friday, call for an appointment  Return instructions:   Please return to the Emergency Department if you experience worsening symptoms.   Please return if you have any other emergent concerns.  Additional Information:  Your vital signs today were: BP 138/90   Pulse 86   Temp 98.2 F (36.8 C) (Oral)   Resp 15   Ht 5\' 6"  (1.676 m)   Wt 99.8 kg   LMP 01/09/2020   SpO2 98%   BMI 35.51 kg/m  If your blood pressure (BP) was elevated above 135/85 this visit, please have this repeated by your doctor within one month. --------------

## 2020-03-08 LAB — GC/CHLAMYDIA PROBE AMP (~~LOC~~) NOT AT ARMC
Chlamydia: NEGATIVE
Comment: NEGATIVE
Comment: NORMAL
Neisseria Gonorrhea: NEGATIVE

## 2020-03-20 ENCOUNTER — Inpatient Hospital Stay (HOSPITAL_COMMUNITY)
Admission: AD | Admit: 2020-03-20 | Discharge: 2020-03-20 | Disposition: A | Discharge status: Home / Self Care | Payer: Medicaid Other | Source: Ambulatory Visit | Attending: Obstetrics & Gynecology | Admitting: Obstetrics & Gynecology

## 2020-03-20 ENCOUNTER — Inpatient Hospital Stay (HOSPITAL_COMMUNITY): Payer: Medicaid Other

## 2020-03-20 ENCOUNTER — Encounter (HOSPITAL_COMMUNITY): Payer: Self-pay | Admitting: *Deleted

## 2020-03-20 DIAGNOSIS — Z3A10 10 weeks gestation of pregnancy: Secondary | ICD-10-CM

## 2020-03-20 DIAGNOSIS — O209 Hemorrhage in early pregnancy, unspecified: Secondary | ICD-10-CM | POA: Insufficient documentation

## 2020-03-20 DIAGNOSIS — O98811 Other maternal infectious and parasitic diseases complicating pregnancy, first trimester: Secondary | ICD-10-CM | POA: Insufficient documentation

## 2020-03-20 DIAGNOSIS — B373 Candidiasis of vulva and vagina: Secondary | ICD-10-CM

## 2020-03-20 DIAGNOSIS — O99891 Other specified diseases and conditions complicating pregnancy: Secondary | ICD-10-CM | POA: Diagnosis not present

## 2020-03-20 DIAGNOSIS — B3731 Acute candidiasis of vulva and vagina: Secondary | ICD-10-CM

## 2020-03-20 DIAGNOSIS — Z3A01 Less than 8 weeks gestation of pregnancy: Secondary | ICD-10-CM | POA: Insufficient documentation

## 2020-03-20 DIAGNOSIS — O4691 Antepartum hemorrhage, unspecified, first trimester: Secondary | ICD-10-CM | POA: Diagnosis not present

## 2020-03-20 LAB — URINALYSIS, ROUTINE W REFLEX MICROSCOPIC
Bacteria, UA: NONE SEEN
Bilirubin Urine: NEGATIVE
Glucose, UA: >=500 mg/dL — AB
Hgb urine dipstick: NEGATIVE
Ketones, ur: NEGATIVE mg/dL
Leukocytes,Ua: NEGATIVE
Nitrite: NEGATIVE
Protein, ur: NEGATIVE mg/dL
Specific Gravity, Urine: 1.035 — ABNORMAL HIGH (ref 1.005–1.030)
pH: 6 (ref 5.0–8.0)

## 2020-03-20 LAB — WET PREP, GENITAL
Clue Cells Wet Prep HPF POC: NONE SEEN
Sperm: NONE SEEN
Trich, Wet Prep: NONE SEEN

## 2020-03-20 LAB — CBC
HCT: 45.5 % (ref 36.0–46.0)
Hemoglobin: 15.2 g/dL — ABNORMAL HIGH (ref 12.0–15.0)
MCH: 30.9 pg (ref 26.0–34.0)
MCHC: 33.4 g/dL (ref 30.0–36.0)
MCV: 92.5 fL (ref 80.0–100.0)
Platelets: 306 10*3/uL (ref 150–400)
RBC: 4.92 MIL/uL (ref 3.87–5.11)
RDW: 11.2 % — ABNORMAL LOW (ref 11.5–15.5)
WBC: 9 10*3/uL (ref 4.0–10.5)
nRBC: 0 % (ref 0.0–0.2)

## 2020-03-20 LAB — HIV ANTIBODY (ROUTINE TESTING W REFLEX): HIV Screen 4th Generation wRfx: NONREACTIVE

## 2020-03-20 LAB — HCG, QUANTITATIVE, PREGNANCY: hCG, Beta Chain, Quant, S: 21713 m[IU]/mL — ABNORMAL HIGH (ref <5)

## 2020-03-20 MED ORDER — TERCONAZOLE 0.4 % VA CREA: 1.0000 | TOPICAL_CREAM | Freq: Every day | VAGINAL | 1 refills | Status: AC

## 2020-03-20 NOTE — MAU Provider Note (Signed)
History     CSN: [id number]  Arrival date and time: 03/20/20 1234   First Provider Initiated Contact with Patient 03/20/20 1454      Chief Complaint  Patient presents with  . Vaginal Discharge   Ms.  [name] [name] is a 29 y.o. year old G79P1001 female at 10.[redacted] weeks gestation by LMP who presents to MAU reporting she passed brown, sticky stuff @ 0300 when she was in the BR wiping. She reports she saw the brown d/c once more, but a "lesser amount." She denies pain. She was treated for (+) trichomonas on 03/07/20. She reports her S.O. has not been treated, but she has had SI yesterday (03/20/20). She reports that he is in the ER right now getting treated, since she had to come here. She    OB History    Gravida  2   Para  1   Term  1   Preterm      AB      Living  1     SAB      TAB      Ectopic      Multiple      Live Births  1           Past Medical History:  Diagnosis Date  . Medical history non-contributory     Past Surgical History:  Procedure Laterality Date  . NO PAST SURGERIES      Family History  Problem Relation Age of Onset  . Diabetes Mother   . Cancer Maternal Grandmother        Breast    Social History   Tobacco Use  . Smoking status: Former Smoker    Types: Cigarettes  . Smokeless tobacco: Never Used  Substance Use Topics  . Alcohol use: Not Currently  . Drug use: Not Currently    Types: Marijuana    Allergies: No Known Allergies  Medications Prior to Admission  Medication Sig Dispense Refill Last Dose  . Prenatal Vit-Fe Fumarate-FA (MULTIVITAMIN-PRENATAL) 27-0.8 MG TABS tablet Take 1 tablet by mouth daily at 12 noon.   03/18/2020 at 1500  . albuterol (PROVENTIL HFA;VENTOLIN HFA) 108 (90 Base) MCG/ACT inhaler Inhale 2 puffs into the lungs every 6 (six) hours as needed for wheezing or shortness of breath. 1 Inhaler 0   . doxycycline (VIBRAMYCIN) 100 MG capsule Take 1 capsule (100 mg total) by mouth 2 (two) times daily. 20 capsule  0   . etonogestrel (IMPLANON) 68 MG IMPL implant Inject 1 each into the skin once.     . metroNIDAZOLE (FLAGYL) 500 MG tablet Take 1 tablet (500 mg total) by mouth 2 (two) times daily. 14 tablet 0   . ondansetron (ZOFRAN ODT) 4 MG disintegrating tablet Take 1 tablet (4 mg total) by mouth every 8 (eight) hours as needed for nausea or vomiting. 20 tablet 0   . traMADol (ULTRAM) 50 MG tablet Take 1 tablet (50 mg total) by mouth every 6 (six) hours as needed. 15 tablet 0     Review of Systems  Constitutional: Negative.   HENT: Negative.   Eyes: Negative.   Respiratory: Negative.   Cardiovascular: Negative.   Gastrointestinal: Negative.   Endocrine: Negative.   Genitourinary: Positive for vaginal bleeding.  Musculoskeletal: Negative.   Skin: Negative.   Allergic/Immunologic: Negative.   Neurological: Negative.   Hematological: Negative.   Psychiatric/Behavioral: Negative.    Physical Exam   Blood pressure 122/77, pulse 70, temperature 98.8 F (37.1 C), temperature source  Oral, resp. rate 16, height 5\' 6"  (1.676 m), weight 102.6 kg, last menstrual period 01/09/2020, SpO2 99 %.  Physical Exam  Nursing note and vitals reviewed. Constitutional: She is oriented to person, place, and time. She appears well-developed and well-nourished.  HENT:  Head: Normocephalic and atraumatic.  Eyes: Pupils are equal, round, and reactive to light.  Cardiovascular: Normal rate and regular rhythm.  Respiratory: Effort normal.  GI: Soft.  Genitourinary:    Genitourinary Comments: Uterus: non-tender, SE: cervix is smooth, pink, no lesions, moderate amt of thick, clumpy, brown vaginal d/c -- WP, GC/CT done, closed/long/firm, no CMT or friability, no adnexal tenderness    Musculoskeletal:        General: Normal range of motion.     Cervical back: Normal range of motion.  Neurological: She is alert and oriented to person, place, and time.  Skin: Skin is warm and dry.  Psychiatric: She has a normal mood  and affect. Her behavior is normal. Judgment and thought content normal.    MAU Course  Procedures  MDM CBC HIV HCG Wet Prep  GC/CT -- Results pending  OB U/S <14 wks  Results for orders placed or performed during the hospital encounter of 03/20/20 (from the past 24 hour(s))  HIV Antibody (routine testing w rflx)     Status: None   Collection Time: 03/20/20  1:55 PM  Result Value Ref Range   HIV Screen 4th Generation wRfx Non Reactive Non Reactive  CBC     Status: Abnormal   Collection Time: 03/20/20  1:55 PM  Result Value Ref Range   WBC 9.0 4.0 - 10.5 K/uL   RBC 4.92 3.87 - 5.11 MIL/uL   Hemoglobin 15.2 (H) 12.0 - 15.0 g/dL   HCT 03/22/20 94.1 - 74.0 %   MCV 92.5 80.0 - 100.0 fL   MCH 30.9 26.0 - 34.0 pg   MCHC 33.4 30.0 - 36.0 g/dL   RDW 81.4 (L) 48.1 - 85.6 %   Platelets 306 150 - 400 K/uL   nRBC 0.0 0.0 - 0.2 %  hCG, quantitative, pregnancy     Status: Abnormal   Collection Time: 03/20/20  1:55 PM  Result Value Ref Range   hCG, Beta Chain, Quant, S 21,713 (H) <5 mIU/mL  Urinalysis, Routine w reflex microscopic     Status: Abnormal   Collection Time: 03/20/20  3:12 PM  Result Value Ref Range   Color, Urine YELLOW YELLOW   APPearance CLEAR CLEAR   Specific Gravity, Urine 1.035 (H) 1.005 - 1.030   pH 6.0 5.0 - 8.0   Glucose, UA >=500 (A) NEGATIVE mg/dL   Hgb urine dipstick NEGATIVE NEGATIVE   Bilirubin Urine NEGATIVE NEGATIVE   Ketones, ur NEGATIVE NEGATIVE mg/dL   Protein, ur NEGATIVE NEGATIVE mg/dL   Nitrite NEGATIVE NEGATIVE   Leukocytes,Ua NEGATIVE NEGATIVE   RBC / HPF 0-5 0 - 5 RBC/hpf   WBC, UA 0-5 0 - 5 WBC/hpf   Bacteria, UA NONE SEEN NONE SEEN   Squamous Epithelial / LPF 0-5 0 - 5  Wet prep, genital     Status: Abnormal   Collection Time: 03/20/20  3:12 PM   Specimen: Cervix  Result Value Ref Range   Yeast Wet Prep HPF POC PRESENT (A) NONE SEEN   Trich, Wet Prep NONE SEEN NONE SEEN   Clue Cells Wet Prep HPF POC NONE SEEN NONE SEEN   WBC, Wet Prep  HPF POC MODERATE (A) NONE SEEN   Sperm NONE SEEN  US OB LESS THAN 14 WEEKS WITH OB TRANSVAGINAL  Result Date: 03/20/2020 CLINICAL DATA:  Brownish discharge for several hours, positive pregnancy test EXAM: OBSTETRIC <14 WK [place] AND TRANSVAGINAL OB US TECHNIQUE: Both transabdominal and transvaginal ultrasound examinations were performed for complete evaluation of the gestation as well as the maternal uterus, adnexal regions, and pelvic cul-de-sac. Transvaginal technique was performed to assess early pregnancy. COMPARISON:  None. FINDINGS: Intrauterine gestational sac: Present Yolk sac:  Present Embryo:  Present Cardiac Activity: Present Heart Rate: 178 bpm CRL:  16.6 mm   8 w   0 d                  [place] EDC: 10/30/2020 Subchorionic hemorrhage:  None visualized. Maternal uterus/adnexae: Within normal limits IMPRESSION: Single live intrauterine gestation at 8 weeks 0 days no acute abnormality noted. Electronically Signed   By: Alcide Clever M.D.   On: 03/20/2020 15:45    Assessment and Plan  Candida vaginitis - Rx for Terazol cream to use internally and externally - Information provided on vaginal yeast infection   Vaginal bleeding affecting early pregnancy  - Information provided on vaginal bleeding in pregnancy  - Discharge patient - Keep scheduled appt with [facility] - Patient verbalized an understanding of the plan of care and agrees.   Raelyn Mora, MSN, CNM 03/20/2020, 2:54 PM

## 2020-03-20 NOTE — Discharge Instructions (Signed)
Return to MAU: °· If you have heavier bleeding that soaks through more that 2 pads per hour for an hour or more °· If you bleed so much that you feel like you might pass out or you do pass out °· If you have significant abdominal pain that is not improved with Tylenol  °· If you develop a fever > 100.5 °  °

## 2020-03-20 NOTE — MAU Note (Signed)
U/S picture given to patient

## 2020-03-20 NOTE — MAU Note (Signed)
Last night around 0300, went to the bathroom, when she wiped she saw "small amt of brown sticky stuff",  Saw it again later, not near as much. No pain.

## 2020-03-21 LAB — GC/CHLAMYDIA PROBE AMP (~~LOC~~) NOT AT ARMC
Chlamydia: NEGATIVE
Comment: NEGATIVE
Comment: NORMAL
Neisseria Gonorrhea: NEGATIVE

## 2020-04-05 ENCOUNTER — Other Ambulatory Visit: Payer: Self-pay

## 2020-04-15 ENCOUNTER — Other Ambulatory Visit: Payer: Self-pay

## 2020-04-26 ENCOUNTER — Ambulatory Visit: Payer: Medicaid Other | Admitting: Dietician

## 2020-05-02 ENCOUNTER — Other Ambulatory Visit: Payer: Self-pay

## 2020-05-10 ENCOUNTER — Other Ambulatory Visit: Payer: Self-pay

## 2020-05-21 ENCOUNTER — Other Ambulatory Visit: Payer: Self-pay | Admitting: Obstetrics and Gynecology

## 2020-05-30 ENCOUNTER — Other Ambulatory Visit: Payer: Self-pay | Admitting: Obstetrics and Gynecology

## 2020-05-30 DIAGNOSIS — O280 Abnormal hematological finding on antenatal screening of mother: Secondary | ICD-10-CM

## 2020-05-30 DIAGNOSIS — Z363 Encounter for antenatal screening for malformations: Secondary | ICD-10-CM

## 2020-05-30 DIAGNOSIS — Z3A19 19 weeks gestation of pregnancy: Secondary | ICD-10-CM

## 2020-05-31 ENCOUNTER — Other Ambulatory Visit: Payer: Self-pay

## 2020-05-31 ENCOUNTER — Encounter: Payer: Medicaid Other | Attending: Obstetrics and Gynecology | Admitting: Registered"

## 2020-05-31 ENCOUNTER — Ambulatory Visit: Payer: Medicaid Other | Admitting: Registered"

## 2020-05-31 ENCOUNTER — Ambulatory Visit: Payer: Medicaid Other

## 2020-05-31 DIAGNOSIS — O24119 Pre-existing diabetes mellitus, type 2, in pregnancy, unspecified trimester: Secondary | ICD-10-CM | POA: Diagnosis not present

## 2020-05-31 NOTE — Progress Notes (Signed)
Patient was seen on 05/31/20 for potential type 2 diabetes in pregnancy. EDD 10/30/20. Patient states no history of diabetes or pre-diabetes. Diet history obtained. Patient eats variety of all food groups. Beverages include water.  Patient is likely consuming excess carbohydrates in the form of fruit.   A1c 10.3% per referral 04/11/20  Pt states since referral she has switched to her beverages to only water and is walking most evenings.   The following learning objectives were met by the patient :   States the difference between Type 2 DM and Gestational Diabetes  States the reason for medication needs during pregnancy  States why dietary management is important in controlling blood glucose  Describes the effects of carbohydrates on blood glucose levels  Demonstrates carbohydrate counting   States the effect of exercise on blood glucose levels  States when to check blood glucose levels  Demonstrates proper blood glucose monitoring techniques  Will complete education at follow-up visit:  Demonstrates ability to create a balanced meal plan  States the effect of stress on blood glucose levels  States the importance of limiting caffeine and abstaining from alcohol and smoking  Plan:  Aim for 3 Carbohydrate Choices per meal (45 grams) +/- 1 either way  Aim for 1-2 Carbohydrate Choices per snack Begin reading food labels for Total Carbohydrate of foods If OK with your MD, consider  increasing your activity level by walking, Arm Chair Exercises or other activity daily as tolerated Begin checking Blood Glucose before breakfast and 2 hours after first bite of breakfast, lunch and dinner as directed by MD  Bring Log Book/Sheet and meter to every medical appointment  Take medication if directed by MD  Blood glucose monitor given: Acc[employer] Lot #[id number] Exp: 08/15/2021 CBG: 240 mg/dL  Patient will call MD to have Rx called into pharmacy.  Patient instructed to monitor glucose  levels: FBS: 70 - 95 mg/dl 2 hour: <120 mg/dl (if using insulin 100-120 mg/dL)  Patient received the following handouts:  Pre-existing Diabetes in Pregnancy  Carbohydrate Counting List  Blood glucose Log Sheet  Patient will be seen tomorrow for completion of edcuation.

## 2020-06-01 ENCOUNTER — Encounter: Payer: Medicaid Other | Admitting: Registered"

## 2020-06-01 ENCOUNTER — Other Ambulatory Visit: Payer: Self-pay

## 2020-06-01 DIAGNOSIS — O24119 Pre-existing diabetes mellitus, type 2, in pregnancy, unspecified trimester: Secondary | ICD-10-CM | POA: Diagnosis not present

## 2020-06-05 ENCOUNTER — Other Ambulatory Visit: Payer: Self-pay

## 2020-06-06 ENCOUNTER — Ambulatory Visit: Payer: Medicaid Other | Attending: Obstetrics and Gynecology | Admitting: Genetic Counselor

## 2020-06-06 ENCOUNTER — Other Ambulatory Visit: Payer: Self-pay

## 2020-06-06 ENCOUNTER — Ambulatory Visit (HOSPITAL_BASED_OUTPATIENT_CLINIC_OR_DEPARTMENT_OTHER): Payer: Medicaid Other

## 2020-06-06 ENCOUNTER — Ambulatory Visit: Payer: Medicaid Other | Admitting: *Deleted

## 2020-06-06 ENCOUNTER — Other Ambulatory Visit: Payer: Self-pay | Admitting: *Deleted

## 2020-06-06 ENCOUNTER — Ambulatory Visit: Payer: Self-pay | Admitting: Genetic Counselor

## 2020-06-06 VITALS — BP 112/68 | HR 77 | Wt 233.5 lb

## 2020-06-06 DIAGNOSIS — Z3A19 19 weeks gestation of pregnancy: Secondary | ICD-10-CM | POA: Insufficient documentation

## 2020-06-06 DIAGNOSIS — O280 Abnormal hematological finding on antenatal screening of mother: Secondary | ICD-10-CM | POA: Diagnosis not present

## 2020-06-06 DIAGNOSIS — O24112 Pre-existing diabetes mellitus, type 2, in pregnancy, second trimester: Secondary | ICD-10-CM | POA: Insufficient documentation

## 2020-06-06 DIAGNOSIS — O281 Abnormal biochemical finding on antenatal screening of mother: Secondary | ICD-10-CM | POA: Insufficient documentation

## 2020-06-06 DIAGNOSIS — O24119 Pre-existing diabetes mellitus, type 2, in pregnancy, unspecified trimester: Secondary | ICD-10-CM

## 2020-06-06 DIAGNOSIS — O0990 Supervision of high risk pregnancy, unspecified, unspecified trimester: Secondary | ICD-10-CM

## 2020-06-06 DIAGNOSIS — O285 Abnormal chromosomal and genetic finding on antenatal screening of mother: Secondary | ICD-10-CM | POA: Diagnosis not present

## 2020-06-06 DIAGNOSIS — Z363 Encounter for antenatal screening for malformations: Secondary | ICD-10-CM | POA: Diagnosis present

## 2020-06-06 DIAGNOSIS — O99332 Smoking (tobacco) complicating pregnancy, second trimester: Secondary | ICD-10-CM

## 2020-06-06 DIAGNOSIS — F17200 Nicotine dependence, unspecified, uncomplicated: Secondary | ICD-10-CM | POA: Diagnosis not present

## 2020-06-06 DIAGNOSIS — Z315 Encounter for genetic counseling: Secondary | ICD-10-CM

## 2020-06-06 NOTE — Progress Notes (Signed)
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 [name] [name] was accompanied to her appointment by her partner, Estanislado Pandy.   Indication for genetic counseling - NIPS high riskfor triploidy, trisomy 77, or trisomy 18due to insufficient fetal DNA fraction - Increased risk to be a silent (2+0) carrier for spinal muscular atrophy  Prenatal history  [name] [name] is a G3P1011, 29 y.o. female. Her current pregnancy has completed [redacted]w[redacted]d (Estimated Date of Delivery: 10/30/20). [name] [name] has a 74 year old daughter, Ava, from a prior relationship. She also had a miscarriage around 60 weeks' gestation with her current partner. This is the couple's second pregnancy.   [name] [name] denied exposure to environmental toxins or chemical agents. She denied the use of alcohol, tobacco or street drugs. She reported taking prenatal vitamins. She denied significant viral illnesses, fevers, and bleeding during the course of her pregnancy. Her medical and surgical histories were noncontributory.  Family History  A three generation pedigree was drafted and reviewed. The family history is remarkable for the following:  - [name] [name] has a sister with a heart murmur. The exact etiology of this murmur is unknown. [name] [name]. Nemitz partner, Estanislado Pandy, also has a sister who was born with a "hole in her heart" that did not require surgery. We reviewed that heart murmurs and congenital heart defects (CHDs) can be isolated or a feature of an underlying genetic condition. CHDs are most often multifactorial in etiology, but can also result from chromosome aberrations, single gene conditions, or teratogenic  exposures. We discussed that isolated, nonsyndromic CHDs occur in approximately 0.5-1% of the general population. If the couple's sisters had isolated CHDs, the risk of recurrence is expected to be 1%. If however, their sisters had an underlying genetic condition that caused their CHDs, the risk of recurrence could be increased. In this case, the specific chance of recurrence depends on the inheritance of the condition. Without further information, an accurate risk assessment cannot be provided.   - [name] has a history of seizures. These were diagnosed when he was 29 years old and stopped when he was [age]. He has not had a seizure since then. He never had genetic testing to attempt to determine the etiology of his seizures. We discussed that seizures can occur secondary to a variety of environmental, lifestyle, and genetic factors. When epilepsy does not have an identified genetic cause, the chance that a child of an affected parent will also have or develop epilepsy is approximately 4%. However, without knowing the etiology of the seizures in the family, precise risk assessment is limited.  The remaining family histories were reviewed and found to be noncontributory for birth defects, intellectual disability, recurrent pregnancy loss, and known genetic conditions. [name] had limited information about portions of his family history; thus, risk assessment was limited.  The patient's ethnicity is African American. The father of the pregnancy's ethnicity is [place] American and [place]. Ashkenazi Jewish ancestry and consanguinity were denied. Pedigree will be scanned under Media.  Discussion  NIPS result:  [name] [name] had Panorama noninvasive prenatal screening (NIPS) through [place] that was high risk due to insufficient fetal DNA fraction. [name] [name] had three blood draws performed for NIPS. She received no result  for her first blood draw due to insufficient fetal DNA, with a fetal fraction of  1.6% at [redacted]w[redacted]d. Her second blood draw had an insufficient fetal fraction of 1.9% at [redacted]w[redacted]d. Her third blood draw had an insufficient fetal fraction of 2.1% at [redacted]w[redacted]d. Given the insufficient fetal fractions, an additional analysis was performed on [name] [name]'s first sample by the laboratory [facility] that identified the pregnancy as being at high risk (1 in 17, or ~6%) for triploidy, trisomy 9, and trisomy 54.      Panorama NIPS utilizes single nucleotide polymorphisms (SNPs) to distinguish maternal from placental DNA. The term "fetal fraction" refers to the amount of sample that is believed to have come from placental DNA rather than maternal DNA. We reviewed that there are many possible reasons a sample may have a low fetal fraction, including early gestational age, high maternal BMI, suboptimal sample collection, maternal use of medications like low molecular weight heparin, pregnancy loss, pregnancy complications, and normal variation. Pregnancies affected by triploidy, trisomy 82, and trisomy 65 have all been associated with low fetal fraction on NIPS as well; however, there is no increased incidence of trisomy 45 or monosomy X in cases with low fetal fraction. It is thought that pregnancies with triploidy, trisomy 43, and trisomy 18 may have smaller placentas, which could result in an insufficient fetal fraction. We briefly reviewed that triploidy, trisomy 55, and trisomy 18 are lethal conditions that impact various organ systems throughout the body. Fetuses with triploidy, trisomy 41, and trisomy 48 often pass away during pregnancy; affected infants often pass away very shortly after birth, and most do not survive beyond 28 year of age.  We discussed that redrawing a new sample for NIPS is possible. Now that she is a few weeks further along in her pregnancy, the fetal fraction will likely be higher. If the redraw was successful and contained a sufficient fetal fraction, results could clarify the risks for  chromosomal aneuploidy in the current pregnancy. [name] [name] indicated that she would not be interested in a redraw for Panorama NIPS.  Ultrasound:   A complete ultrasound was performed today prior to our visit. The ultrasound report will be sent under separate cover. There were no visualized fetal anomalies or markers suggestive of aneuploidy. [name] [name] was counseled that ~50% of fetuses with Down syndrome and 90-95% of fetuses with trisomy 7, trisomy 63, or triploidy demonstrate a sign of the respective conditions on anatomy ultrasound.  Carrier screening results:  [name] [name] had carrier screening performed through [facility] panel. 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 This puts her at increased risk (1 in 53) to be a silent 2+0 carrier for spinal muscular atrophy (SMA). SMA is a condition caused by mutations in the SMN1 gene. Typically, individuals have two copies of the SMN1 gene, with one copy present on each chromosome. In SMA silent carriers, both copies of the SMN1 gene are present on one chromosome, with no copies of SMN1 present on the other chromosome.   SMA is characterized by progressive muscle weakness and atrophy due to degeneration and loss of anterior horn cells (lower motor neurons) in the spinal cord and brain stem. We discussed the different types of SMA (0, I, II, and III), including differences in severity and age of onset. We also reviewed the autosomal recessive inheritance pattern  of SMA. We discussed that the couple only has a chance of having a child with SMA if both parents are identified as carriers for the condition. Based on the pan-ethnic carrier frequency for SMA, [name] [name]'s partner currently has a 1 in 49 chance of being a carrier of SMA. Without partner screening to refine risk and based on her partner's  ethnicity, the couple currently has a 1 in 6664 (0.02%) risk of having a child with SMA. If [name] [name]'s partner were found to have 2 copies of SMN1, his risk of being a carrier is reduced but not eliminated. However, if both parents are carriers of SMA, there is a 1 in 4 (25%) chance of having an affected fetus.   [name] [name] carrier screening was negative for the other 3 conditions screened. Additionally, she had a negative hemoglobin electrophoresis for hemoglobinopathies. Thus, her risk to be a carrier for these additional conditions (listed separately in the laboratory reports) has been reduced but not eliminated. This also significantly reduces her risk of having a child affected by one of these conditions. We discussed that carrier testing for SMA is recommended for [name] [name]'s partner. [name] [name]. Ueda and her partner indicated that they were interested in pursuing partner carrier screening.  Diagnostic testing:  [name] [name]. Liskey was also counseled regarding diagnostic testing via amniocentesis. We discussed the technical aspects of the procedure and quoted up to a 1 in 500 (0.2%) risk for spontaneous pregnancy loss or other adverse pregnancy outcomes as a result of amniocentesis. Cultured cells from an amniocentesis sample allow for the visualization of a fetal karyotype, which can detect >99% of chromosomal aberrations. Chromosomal microarray can also be performed to identify smaller deletions or duplications of fetal chromosomal material. Amniocentesis could also be performed to assess whether the baby is affected by SMA. After careful consideration, [name] [name]. Erlich declined amniocentesis at this time. She understands that amniocentesis is available at any point after 16 weeks of pregnancy and that she may opt to undergo the procedure at a later date should she change her mind.  Plan:  [name] [name]. Robello declined further aneuploidy screening/testing today. She felt reassured by her normal ultrasound. She also was  understandably tired of providing samples for NIPS just for them to fail.  [name] provided a saliva sample for SMA partner carrier screening today. Testing was ordered through the laboratory [facility], as [name] qualified for free testing through their Patient Assistance Program. He agreed to email me a photo of his most recent tax forms so that I can forward them to the lab for income verification. Results from partner carrier screening will take 2-3 weeks to be returned. I will call the couple once results become available.  I counseled [name] [name]. Arredondo regarding the above risks and available options. The approximate face-to-face time with the genetic counselor was 50 minutes.  In summary:  Discussed NIPS result and options for follow-up testing  High risk(1 in 17, ~6%) for triploidy, trisomy 74, and trisomy 52 due to low fetal fraction  Declined another redraw for NIPS  Reviewed results of ultrasound  No fetal anomalies or markers seen  Reduction in risk for fetal aneuploidy  Discussed carrier screening results  Increased risk (1 in 34, ~3%) of being a silent carrier for spinal muscular atrophy  Partner provided saliva sample for partner carrier screening today. We will follow results  Offered additional testing and screening  Declined amniocentesis  Reviewed family history concerns   [name], [name] [name], Aeronautical engineer

## 2020-06-08 NOTE — Progress Notes (Signed)
Patient was seen on 06/01/20 for follow-up assessment and education for pre-existing Diabetes in pregnancy EDD 10/30/20. Patient states changes to diet/lifestyle including increased water intake    Patient just started testing blood glucose as directed pre breakfast and 2 hours after each meal. FBS: 190 mg/dL & 2 hrs post meal 728 mg/dL  The following learning objectives reviewed during follow-up visit:   Foods that affect blood sugar  Insulin options  Plan:  . Continue to check blood sugar as directed by MD . Aim to eat balanced meals, may need to reduce carb intake below standard 3-4 choice per meal (45-60 grams) . Continue with increased water intake   Patient instructed to monitor glucose levels: FBS: 60 - 95 mg/dl 2 hour: <206 mg/d  Patient will be seen for follow-up as needed.

## 2020-06-11 ENCOUNTER — Encounter: Payer: Self-pay | Admitting: Registered"

## 2020-06-15 ENCOUNTER — Encounter: Payer: Self-pay | Admitting: Genetic Counselor

## 2020-06-23 ENCOUNTER — Encounter (HOSPITAL_COMMUNITY): Payer: Self-pay

## 2020-06-23 ENCOUNTER — Other Ambulatory Visit: Payer: Self-pay

## 2020-06-23 ENCOUNTER — Emergency Department (HOSPITAL_COMMUNITY)
Admission: EM | Admit: 2020-06-23 | Discharge: 2020-06-23 | Disposition: A | Discharge status: Home / Self Care | Payer: Medicaid Other | Attending: Emergency Medicine | Admitting: Emergency Medicine

## 2020-06-23 DIAGNOSIS — Z3A23 23 weeks gestation of pregnancy: Secondary | ICD-10-CM | POA: Insufficient documentation

## 2020-06-23 DIAGNOSIS — E119 Type 2 diabetes mellitus without complications: Secondary | ICD-10-CM | POA: Diagnosis not present

## 2020-06-23 DIAGNOSIS — O26899 Other specified pregnancy related conditions, unspecified trimester: Secondary | ICD-10-CM | POA: Diagnosis not present

## 2020-06-23 DIAGNOSIS — K0889 Other specified disorders of teeth and supporting structures: Secondary | ICD-10-CM

## 2020-06-23 MED ORDER — CLINDAMYCIN HCL 150 MG PO CAPS: 150.0000 mg | ORAL_CAPSULE | Freq: Four times a day (QID) | ORAL | 0 refills | Status: DC

## 2020-06-23 MED ORDER — ACETAMINOPHEN 500 MG PO TABS: 500.0000 mg | ORAL_TABLET | Freq: Four times a day (QID) | ORAL | 0 refills | Status: DC | PRN

## 2020-06-23 NOTE — ED Provider Notes (Signed)
[facility]-EMERGENCY DEPT Provider Note   CSN: [id number] Arrival date & time: 06/23/20  0054     History Chief Complaint  Patient presents with  . Dental Pain    [name] [name] is a 29 y.o. female.  The history is provided by the patient. No language interpreter was used.  Dental Pain Associated symptoms: no fever      29 year old female with history of diabetes presenting for evaluation of dental pain.  Patient report intermittent pain to her right upper tooth ongoing for the past week.  States the pain initially was intense, improves for several days but has returned in the past 2 days.  Described pain as a sharp throbbing sensation worse with chewing and now worse with temperature change.  Pain is moderate in severity and she felt that her gum is swollen.  No report of any fever or chills no trouble swallowing.  Did report pain radiates towards her right ear but denies any hearing changes.  No neck pain.  No specific treatment tried.  She is currently [redacted] weeks pregnant and denies any pregnancy related complication.  She is a G2, P1.  She does not have a Education officer, community.  Past Medical History:  Diagnosis Date  . Diabetes mellitus without complication [facility])     Patient Active Problem List   Diagnosis Date Noted  . Pre-existing type 2 diabetes mellitus during pregnancy, antepartum 05/31/2020  . Bronchitis, acute, with bronchospasm 08/22/2012  . Fever 08/22/2012  . Tobacco abuse 08/22/2012    Past Surgical History:  Procedure Laterality Date  . NO PAST SURGERIES       OB History    Gravida  3   Para  1   Term  1   Preterm      AB  1   Living  1     SAB  1   TAB      Ectopic      Multiple      Live Births  1           Family History  Problem Relation Age of Onset  . Diabetes Mother   . Cancer Maternal Grandmother        Breast    Social History   Tobacco Use  . Smoking status: Former Smoker    Types: Cigarettes  . Smokeless  tobacco: Never Used  Vaping Use  . Vaping Use: Never used  Substance Use Topics  . Alcohol use: Not Currently  . Drug use: Not Currently    Types: Marijuana    Home Medications Prior to Admission medications   Medication Sig Start Date End Date Taking? Authorizing Provider  albuterol (PROVENTIL HFA;VENTOLIN HFA) 108 (90 Base) MCG/ACT inhaler Inhale 2 puffs into the lungs every 6 (six) hours as needed for wheezing or shortness of breath. Patient not taking: Reported on 06/06/2020 04/18/18   Tilden Fossa, MD  Prenatal Vit-Fe Fumarate-FA (MULTIVITAMIN-PRENATAL) 27-0.8 MG TABS tablet Take 1 tablet by mouth daily at 12 noon.    [provider]    Allergies    Penicillins  Review of Systems   Review of Systems  Constitutional: Negative for fever.  HENT: Negative for dental problem.     Physical Exam Updated Vital Signs BP 115/78 (BP Location: Left Arm)   Pulse 83   Temp 97.8 F (36.6 C) (Oral)   Resp 17   Ht 5\' 6"  (1.676 m)   Wt (!) 104.3 kg   LMP 01/09/2020  SpO2 94%   BMI 37.12 kg/m   Physical Exam Vitals and nursing note reviewed.  Constitutional:      General: She is not in acute distress.    Appearance: She is well-developed.  HENT:     Head: Atraumatic.     Right Ear: Tympanic membrane normal.     Left Ear: Tympanic membrane normal.     Mouth/Throat:     Mouth: Mucous membranes are moist.     Comments: Mild: Tenderness to tooth #1 with minimal gum swelling but no significant erythema.  No trismus. No obvious abscess.   Eyes:     Conjunctiva/sclera: Conjunctivae normal.  Musculoskeletal:     Cervical back: Neck supple. No rigidity.  Lymphadenopathy:     Cervical: No cervical adenopathy.  Skin:    Findings: No rash.  Neurological:     Mental Status: She is alert.  Psychiatric:        Mood and Affect: Mood normal.     ED Results / Procedures / Treatments   Labs (all labs ordered are listed, but only abnormal results are displayed) Labs  Reviewed - No data to display  EKG None  Radiology No results found.  Procedures Procedures (including critical care time)  Medications Ordered in ED Medications - No data to display  ED Course  I have reviewed the triage vital signs and the nursing notes.  Pertinent labs & imaging results that were available during my care of the patient were reviewed by me and considered in my medical decision making (see chart for details).    MDM Rules/Calculators/A&P                          BP 115/78 (BP Location: Left Arm)   Pulse 83   Temp 97.8 F (36.6 C) (Oral)   Resp 17   Ht 5\' 6"  (1.676 m)   Wt (!) 104.3 kg   LMP 01/09/2020   SpO2 94%   BMI 37.12 kg/m   Final Clinical Impression(s) / ED Diagnoses Final diagnoses:  Pain, dental    Rx / DC Orders ED Discharge Orders    None     Patient with dentalgia.  No abscess requiring immediate incision and drainage.  Exam not concerning for Ludwig's angina or pharyngeal abscess.  Will treat with clindamycin and tylenol as pt is pregnant. Pt instructed to follow-up with dentist.  Discussed return precautions. Pt safe for discharge.    01/11/2020, PA-C 06/23/20 06/25/20    0626, MD 06/23/20 1355

## 2020-06-23 NOTE — ED Notes (Signed)
Pt ambulatory from lobby.

## 2020-06-23 NOTE — Discharge Instructions (Signed)
Please take antibiotic as prescribed for your dental pain.  Be sure to eat a yogurt high in probiotic daily while being on antibiotic to decrease the risk of antibiotic associated diarrhea.  Take Tylenol as needed for pain.  Follow-up with dentist for further care.

## 2020-06-23 NOTE — ED Triage Notes (Signed)
Pt sts right upper dental pain starting 2 days ago.

## 2020-06-23 NOTE — ED Notes (Signed)
Discharge paperwork reviewed with pt, including prescription.  Pt with no questions or concerns at this time.  Ambulatory at time of discharge.  

## 2020-06-25 ENCOUNTER — Telehealth: Payer: Self-pay | Admitting: Genetic Counselor

## 2020-06-25 NOTE — Telephone Encounter (Signed)
LVM for Ms. Wassink re: good news about her partner's screening results. Requested a call back to my direct line to discuss these in more detail, as no identifiers were provided in voicemail message.   [name], MS, [facility] Genetic Counselor

## 2020-07-02 ENCOUNTER — Telehealth: Payer: Self-pay | Admitting: Genetic Counselor

## 2020-07-02 ENCOUNTER — Encounter: Payer: Self-pay | Admitting: Pediatric Cardiology

## 2020-07-02 NOTE — Telephone Encounter (Signed)
Attempted to call [name] [name] again to discuss her partner's negative carrier screening results and request a copy of the couple's tax forms to ensure that their testing is free. However, the call went to voicemail and the mailbox was full, so I was unable to leave a message. I will try to discuss this with her at her next ultrasound on 07/04/20.  [name], MS, [facility] Genetic Counselor

## 2020-07-03 ENCOUNTER — Encounter (HOSPITAL_COMMUNITY): Payer: Self-pay | Admitting: Emergency Medicine

## 2020-07-03 ENCOUNTER — Emergency Department (HOSPITAL_COMMUNITY)
Admission: EM | Admit: 2020-07-03 | Discharge: 2020-07-04 | Disposition: A | Discharge status: Home / Self Care | Payer: Medicaid Other | Attending: Emergency Medicine | Admitting: Emergency Medicine

## 2020-07-03 ENCOUNTER — Other Ambulatory Visit: Payer: Self-pay

## 2020-07-03 DIAGNOSIS — O219 Vomiting of pregnancy, unspecified: Secondary | ICD-10-CM | POA: Insufficient documentation

## 2020-07-03 DIAGNOSIS — Z3A23 23 weeks gestation of pregnancy: Secondary | ICD-10-CM | POA: Diagnosis not present

## 2020-07-03 DIAGNOSIS — Z5321 Procedure and treatment not carried out due to patient leaving prior to being seen by health care provider: Secondary | ICD-10-CM | POA: Insufficient documentation

## 2020-07-03 DIAGNOSIS — R109 Unspecified abdominal pain: Secondary | ICD-10-CM | POA: Diagnosis present

## 2020-07-03 LAB — COMPREHENSIVE METABOLIC PANEL
ALT: 22 U/L (ref 0–44)
AST: 14 U/L — ABNORMAL LOW (ref 15–41)
Albumin: 4.1 g/dL (ref 3.5–5.0)
Alkaline Phosphatase: 53 U/L (ref 38–126)
Anion gap: 11 (ref 5–15)
BUN: 13 mg/dL (ref 6–20)
CO2: 23 mmol/L (ref 22–32)
Calcium: 9.7 mg/dL (ref 8.9–10.3)
Chloride: 102 mmol/L (ref 98–111)
Creatinine, Ser: 0.65 mg/dL (ref 0.44–1.00)
GFR calc Af Amer: >60 mL/min (ref >60)
GFR calc non Af Amer: >60 mL/min (ref >60)
Glucose, Bld: 187 mg/dL — ABNORMAL HIGH (ref 70–99)
Potassium: 3.8 mmol/L (ref 3.5–5.1)
Sodium: 136 mmol/L (ref 135–145)
Total Bilirubin: 1.1 mg/dL (ref 0.3–1.2)
Total Protein: 7.5 g/dL (ref 6.5–8.1)

## 2020-07-03 LAB — CBC
HCT: 40.9 % (ref 36.0–46.0)
Hemoglobin: 13.7 g/dL (ref 12.0–15.0)
MCH: 30.9 pg (ref 26.0–34.0)
MCHC: 33.5 g/dL (ref 30.0–36.0)
MCV: 92.3 fL (ref 80.0–100.0)
Platelets: 251 10*3/uL (ref 150–400)
RBC: 4.43 MIL/uL (ref 3.87–5.11)
RDW: 12 % (ref 11.5–15.5)
WBC: 14.2 10*3/uL — ABNORMAL HIGH (ref 4.0–10.5)
nRBC: 0 % (ref 0.0–0.2)

## 2020-07-03 LAB — LIPASE, BLOOD: Lipase: 27 U/L (ref 11–51)

## 2020-07-03 NOTE — ED Triage Notes (Signed)
Patient is [redacted] weeks pregnant, has kept up with her OB/GYN appointments, states she is having severe abdominal cramping since 12:30 today, also chills, vomiting and nausea.

## 2020-07-03 NOTE — ED Notes (Signed)
Attempted to call rapid OB RN x3 for recommendation, no response.

## 2020-07-04 ENCOUNTER — Other Ambulatory Visit: Payer: Self-pay | Admitting: *Deleted

## 2020-07-04 ENCOUNTER — Telehealth: Payer: Self-pay | Admitting: Genetic Counselor

## 2020-07-04 ENCOUNTER — Ambulatory Visit: Payer: Medicaid Other | Attending: Obstetrics and Gynecology

## 2020-07-04 ENCOUNTER — Ambulatory Visit: Payer: Medicaid Other | Admitting: *Deleted

## 2020-07-04 VITALS — BP 110/75 | HR 93

## 2020-07-04 DIAGNOSIS — O24112 Pre-existing diabetes mellitus, type 2, in pregnancy, second trimester: Secondary | ICD-10-CM

## 2020-07-04 DIAGNOSIS — F17200 Nicotine dependence, unspecified, uncomplicated: Secondary | ICD-10-CM

## 2020-07-04 DIAGNOSIS — E669 Obesity, unspecified: Secondary | ICD-10-CM

## 2020-07-04 DIAGNOSIS — Z3A23 23 weeks gestation of pregnancy: Secondary | ICD-10-CM

## 2020-07-04 DIAGNOSIS — O285 Abnormal chromosomal and genetic finding on antenatal screening of mother: Secondary | ICD-10-CM

## 2020-07-04 DIAGNOSIS — O99212 Obesity complicating pregnancy, second trimester: Secondary | ICD-10-CM | POA: Diagnosis not present

## 2020-07-04 DIAGNOSIS — O99332 Smoking (tobacco) complicating pregnancy, second trimester: Secondary | ICD-10-CM

## 2020-07-04 DIAGNOSIS — O24119 Pre-existing diabetes mellitus, type 2, in pregnancy, unspecified trimester: Secondary | ICD-10-CM

## 2020-07-04 DIAGNOSIS — Z363 Encounter for antenatal screening for malformations: Secondary | ICD-10-CM

## 2020-07-04 NOTE — Telephone Encounter (Signed)
I spoke with [name] at her ultrasound appointment today regarding her partner [name] negative carrier screening results for spinal muscular atrophy (SMA). [name] was identified to have 3 copies of the SMN1 gene. This result significantly reduces his risk to be a carrier for SMA. Based on the sensitivity of the screen, there is less than a 1 in 342 residual risk for [name] to be a carrier for SMA. [name] was identified to have a 1 in 34 chance of being a silent carrier for SMA. Given [name]'s negative results, the couple has a <1 in [phone] chance to have a baby affected by SMA.  [name] informed me that she and her partner are in the process of moving and that she has not forgotten about sending me their tax forms. She will send them to me as soon as the couple has unpacked them. She confirmed that she had no further questions at this time.  [name], MS, [facility] Genetic Counselor

## 2020-07-25 ENCOUNTER — Other Ambulatory Visit: Payer: Self-pay | Admitting: *Deleted

## 2020-07-25 ENCOUNTER — Ambulatory Visit: Payer: Medicaid Other | Admitting: *Deleted

## 2020-07-25 ENCOUNTER — Ambulatory Visit: Payer: Medicaid Other | Attending: Obstetrics and Gynecology

## 2020-07-25 ENCOUNTER — Other Ambulatory Visit: Payer: Self-pay

## 2020-07-25 VITALS — BP 117/67 | HR 82

## 2020-07-25 DIAGNOSIS — O24112 Pre-existing diabetes mellitus, type 2, in pregnancy, second trimester: Secondary | ICD-10-CM | POA: Diagnosis not present

## 2020-07-25 DIAGNOSIS — O99332 Smoking (tobacco) complicating pregnancy, second trimester: Secondary | ICD-10-CM

## 2020-07-25 DIAGNOSIS — E669 Obesity, unspecified: Secondary | ICD-10-CM

## 2020-07-25 DIAGNOSIS — O99212 Obesity complicating pregnancy, second trimester: Secondary | ICD-10-CM

## 2020-07-25 DIAGNOSIS — O285 Abnormal chromosomal and genetic finding on antenatal screening of mother: Secondary | ICD-10-CM | POA: Diagnosis not present

## 2020-07-25 DIAGNOSIS — O24119 Pre-existing diabetes mellitus, type 2, in pregnancy, unspecified trimester: Secondary | ICD-10-CM

## 2020-07-25 DIAGNOSIS — O24415 Gestational diabetes mellitus in pregnancy, controlled by oral hypoglycemic drugs: Secondary | ICD-10-CM

## 2020-07-25 DIAGNOSIS — Z148 Genetic carrier of other disease: Secondary | ICD-10-CM

## 2020-07-25 DIAGNOSIS — Z720 Tobacco use: Secondary | ICD-10-CM

## 2020-07-25 DIAGNOSIS — E119 Type 2 diabetes mellitus without complications: Secondary | ICD-10-CM

## 2020-07-25 DIAGNOSIS — O322XX0 Maternal care for transverse and oblique lie, not applicable or unspecified: Secondary | ICD-10-CM

## 2020-07-25 DIAGNOSIS — Z362 Encounter for other antenatal screening follow-up: Secondary | ICD-10-CM

## 2020-07-25 DIAGNOSIS — Z3A26 26 weeks gestation of pregnancy: Secondary | ICD-10-CM

## 2020-07-31 ENCOUNTER — Encounter: Payer: Self-pay | Admitting: Pediatric Cardiology

## 2020-08-15 ENCOUNTER — Ambulatory Visit: Payer: Medicaid Other | Admitting: *Deleted

## 2020-08-15 ENCOUNTER — Ambulatory Visit: Payer: Medicaid Other | Attending: Obstetrics and Gynecology

## 2020-08-15 ENCOUNTER — Encounter: Payer: Self-pay | Admitting: *Deleted

## 2020-08-15 ENCOUNTER — Other Ambulatory Visit: Payer: Self-pay

## 2020-08-15 VITALS — BP 111/69 | HR 88

## 2020-08-15 DIAGNOSIS — O285 Abnormal chromosomal and genetic finding on antenatal screening of mother: Secondary | ICD-10-CM | POA: Diagnosis not present

## 2020-08-15 DIAGNOSIS — O99212 Obesity complicating pregnancy, second trimester: Secondary | ICD-10-CM

## 2020-08-15 DIAGNOSIS — O99332 Smoking (tobacco) complicating pregnancy, second trimester: Secondary | ICD-10-CM

## 2020-08-15 DIAGNOSIS — Z362 Encounter for other antenatal screening follow-up: Secondary | ICD-10-CM

## 2020-08-15 DIAGNOSIS — O24113 Pre-existing diabetes mellitus, type 2, in pregnancy, third trimester: Secondary | ICD-10-CM | POA: Insufficient documentation

## 2020-08-15 DIAGNOSIS — O24112 Pre-existing diabetes mellitus, type 2, in pregnancy, second trimester: Secondary | ICD-10-CM

## 2020-08-15 DIAGNOSIS — Z3A29 29 weeks gestation of pregnancy: Secondary | ICD-10-CM

## 2020-08-15 DIAGNOSIS — O24415 Gestational diabetes mellitus in pregnancy, controlled by oral hypoglycemic drugs: Secondary | ICD-10-CM | POA: Insufficient documentation

## 2020-08-16 ENCOUNTER — Other Ambulatory Visit: Payer: Self-pay | Admitting: *Deleted

## 2020-08-16 DIAGNOSIS — O24119 Pre-existing diabetes mellitus, type 2, in pregnancy, unspecified trimester: Secondary | ICD-10-CM

## 2020-09-05 ENCOUNTER — Ambulatory Visit: Payer: Medicaid Other

## 2020-09-12 ENCOUNTER — Inpatient Hospital Stay (HOSPITAL_COMMUNITY)
Admission: AD | Admit: 2020-09-12 | Discharge: 2020-09-14 | DRG: 805 | Disposition: A | Discharge status: Home / Self Care | Payer: Medicaid Other | Attending: Obstetrics and Gynecology | Admitting: Obstetrics and Gynecology

## 2020-09-12 ENCOUNTER — Other Ambulatory Visit: Payer: Self-pay | Admitting: *Deleted

## 2020-09-12 ENCOUNTER — Other Ambulatory Visit: Payer: Self-pay

## 2020-09-12 ENCOUNTER — Inpatient Hospital Stay (HOSPITAL_COMMUNITY): Payer: Medicaid Other | Admitting: Anesthesiology

## 2020-09-12 ENCOUNTER — Ambulatory Visit (HOSPITAL_BASED_OUTPATIENT_CLINIC_OR_DEPARTMENT_OTHER): Payer: Medicaid Other

## 2020-09-12 ENCOUNTER — Encounter (HOSPITAL_COMMUNITY): Payer: Self-pay | Admitting: Obstetrics and Gynecology

## 2020-09-12 ENCOUNTER — Encounter: Payer: Self-pay | Admitting: *Deleted

## 2020-09-12 ENCOUNTER — Ambulatory Visit: Payer: Medicaid Other | Admitting: *Deleted

## 2020-09-12 VITALS — BP 130/77 | HR 77

## 2020-09-12 DIAGNOSIS — Z20822 Contact with and (suspected) exposure to covid-19: Secondary | ICD-10-CM | POA: Diagnosis present

## 2020-09-12 DIAGNOSIS — O2412 Pre-existing diabetes mellitus, type 2, in childbirth: Secondary | ICD-10-CM | POA: Diagnosis present

## 2020-09-12 DIAGNOSIS — Z3A33 33 weeks gestation of pregnancy: Secondary | ICD-10-CM

## 2020-09-12 DIAGNOSIS — Z87891 Personal history of nicotine dependence: Secondary | ICD-10-CM

## 2020-09-12 DIAGNOSIS — E669 Obesity, unspecified: Secondary | ICD-10-CM

## 2020-09-12 DIAGNOSIS — E1169 Type 2 diabetes mellitus with other specified complication: Secondary | ICD-10-CM | POA: Insufficient documentation

## 2020-09-12 DIAGNOSIS — O99332 Smoking (tobacco) complicating pregnancy, second trimester: Secondary | ICD-10-CM

## 2020-09-12 DIAGNOSIS — Z720 Tobacco use: Secondary | ICD-10-CM

## 2020-09-12 DIAGNOSIS — Z880 Allergy status to penicillin: Secondary | ICD-10-CM | POA: Diagnosis not present

## 2020-09-12 DIAGNOSIS — O24119 Pre-existing diabetes mellitus, type 2, in pregnancy, unspecified trimester: Secondary | ICD-10-CM | POA: Diagnosis present

## 2020-09-12 DIAGNOSIS — E119 Type 2 diabetes mellitus without complications: Secondary | ICD-10-CM | POA: Diagnosis present

## 2020-09-12 DIAGNOSIS — O285 Abnormal chromosomal and genetic finding on antenatal screening of mother: Secondary | ICD-10-CM | POA: Diagnosis not present

## 2020-09-12 DIAGNOSIS — O42913 Preterm premature rupture of membranes, unspecified as to length of time between rupture and onset of labor, third trimester: Secondary | ICD-10-CM | POA: Diagnosis present

## 2020-09-12 DIAGNOSIS — O99212 Obesity complicating pregnancy, second trimester: Secondary | ICD-10-CM | POA: Diagnosis not present

## 2020-09-12 DIAGNOSIS — Z362 Encounter for other antenatal screening follow-up: Secondary | ICD-10-CM

## 2020-09-12 DIAGNOSIS — O42013 Preterm premature rupture of membranes, onset of labor within 24 hours of rupture, third trimester: Secondary | ICD-10-CM | POA: Diagnosis present

## 2020-09-12 DIAGNOSIS — O24112 Pre-existing diabetes mellitus, type 2, in pregnancy, second trimester: Secondary | ICD-10-CM | POA: Diagnosis not present

## 2020-09-12 DIAGNOSIS — O99214 Obesity complicating childbirth: Secondary | ICD-10-CM | POA: Diagnosis present

## 2020-09-12 DIAGNOSIS — Z7984 Long term (current) use of oral hypoglycemic drugs: Secondary | ICD-10-CM

## 2020-09-12 DIAGNOSIS — Z148 Genetic carrier of other disease: Secondary | ICD-10-CM | POA: Diagnosis not present

## 2020-09-12 DIAGNOSIS — O42919 Preterm premature rupture of membranes, unspecified as to length of time between rupture and onset of labor, unspecified trimester: Secondary | ICD-10-CM | POA: Diagnosis present

## 2020-09-12 LAB — URINALYSIS, COMPLETE (UACMP) WITH MICROSCOPIC
Bilirubin Urine: NEGATIVE
Glucose, UA: >=500 mg/dL — AB
Hgb urine dipstick: NEGATIVE
Ketones, ur: 80 mg/dL — AB
Leukocytes,Ua: NEGATIVE
Nitrite: NEGATIVE
Protein, ur: NEGATIVE mg/dL
Specific Gravity, Urine: 1.024 (ref 1.005–1.030)
pH: 5 (ref 5.0–8.0)

## 2020-09-12 LAB — RAPID URINE DRUG SCREEN, HOSP PERFORMED
Amphetamines: NOT DETECTED
Barbiturates: NOT DETECTED
Benzodiazepines: NOT DETECTED
Cocaine: NOT DETECTED
Opiates: NOT DETECTED
Tetrahydrocannabinol: POSITIVE — AB

## 2020-09-12 LAB — URINALYSIS, ROUTINE W REFLEX MICROSCOPIC
Bilirubin Urine: NEGATIVE
Glucose, UA: >=500 mg/dL — AB
Ketones, ur: NEGATIVE mg/dL
Leukocytes,Ua: NEGATIVE
Nitrite: NEGATIVE
Protein, ur: 100 mg/dL — AB
Specific Gravity, Urine: 1.021 (ref 1.005–1.030)
pH: 6 (ref 5.0–8.0)

## 2020-09-12 LAB — CBC
HCT: 42.9 % (ref 36.0–46.0)
Hemoglobin: 14.4 g/dL (ref 12.0–15.0)
MCH: 29.8 pg (ref 26.0–34.0)
MCHC: 33.6 g/dL (ref 30.0–36.0)
MCV: 88.8 fL (ref 80.0–100.0)
Platelets: 256 10*3/uL (ref 150–400)
RBC: 4.83 MIL/uL (ref 3.87–5.11)
RDW: 12 % (ref 11.5–15.5)
WBC: 8.1 10*3/uL (ref 4.0–10.5)
nRBC: 0 % (ref 0.0–0.2)

## 2020-09-12 LAB — RESPIRATORY PANEL BY RT PCR (FLU A&B, COVID)
Influenza A by PCR: NEGATIVE
Influenza B by PCR: NEGATIVE
SARS Coronavirus 2 by RT PCR: NEGATIVE

## 2020-09-12 LAB — TYPE AND SCREEN
ABO/RH(D): AB POS
Antibody Screen: NEGATIVE

## 2020-09-12 LAB — POCT FERN TEST: POCT Fern Test: POSITIVE

## 2020-09-12 LAB — GROUP B STREP BY PCR: Group B strep by PCR: NEGATIVE

## 2020-09-12 MED ORDER — SIMETHICONE 80 MG PO CHEW: 80.0000 mg | CHEWABLE_TABLET | ORAL | Status: DC | PRN

## 2020-09-12 MED ORDER — METFORMIN HCL 500 MG PO TABS
500.0000 mg | ORAL_TABLET | Freq: Two times a day (BID) | ORAL | Status: DC
  Administered 2020-09-13 – 2020-09-14 (×3): 500 mg via ORAL
  Filled 2020-09-12 (×4): qty 1

## 2020-09-12 MED ORDER — PRENATAL MULTIVITAMIN CH
1.0000 | ORAL_TABLET | Freq: Every day | ORAL | Status: DC
  Administered 2020-09-13 – 2020-09-14 (×2): 1 via ORAL
  Filled 2020-09-12 (×3): qty 1

## 2020-09-12 MED ORDER — DOCUSATE SODIUM 100 MG PO CAPS: 100.0000 mg | ORAL_CAPSULE | Freq: Every day | ORAL | Status: DC

## 2020-09-12 MED ORDER — TETANUS-DIPHTH-ACELL PERTUSSIS 5-2.5-18.5 LF-MCG/0.5 IM SUSP: 0.5000 mL | Freq: Once | INTRAMUSCULAR | Status: DC

## 2020-09-12 MED ORDER — LACTATED RINGERS IV SOLN
500.0000 mL | Freq: Once | INTRAVENOUS | Status: AC
  Administered 2020-09-12: 500 mL via INTRAVENOUS

## 2020-09-12 MED ORDER — GENTAMICIN SULFATE 40 MG/ML IJ SOLN
5.0000 mg/kg | INTRAVENOUS | Status: DC
  Administered 2020-09-12: 390 mg via INTRAVENOUS
  Filled 2020-09-12: qty 9.75

## 2020-09-12 MED ORDER — LACTATED RINGERS IV SOLN: 500.0000 mL | INTRAVENOUS | Status: DC | PRN

## 2020-09-12 MED ORDER — LIDOCAINE HCL (PF) 1 % IJ SOLN: 30.0000 mL | INTRAMUSCULAR | Status: DC | PRN

## 2020-09-12 MED ORDER — COCONUT OIL OIL: 1.0000 "application " | TOPICAL_OIL | Status: DC | PRN

## 2020-09-12 MED ORDER — SENNOSIDES-DOCUSATE SODIUM 8.6-50 MG PO TABS
2.0000 | ORAL_TABLET | ORAL | Status: DC
  Administered 2020-09-13 (×2): 2 via ORAL
  Filled 2020-09-12 (×2): qty 2

## 2020-09-12 MED ORDER — FENTANYL-BUPIVACAINE-NACL 0.5-0.125-0.9 MG/250ML-% EP SOLN
EPIDURAL | Status: AC
  Filled 2020-09-12: qty 250

## 2020-09-12 MED ORDER — EPHEDRINE 5 MG/ML INJ: 10.0000 mg | INTRAVENOUS | Status: DC | PRN

## 2020-09-12 MED ORDER — SODIUM CHLORIDE (PF) 0.9 % IJ SOLN
INTRAMUSCULAR | Status: DC | PRN
  Administered 2020-09-12: 12 mL/h via EPIDURAL

## 2020-09-12 MED ORDER — ACETAMINOPHEN 325 MG PO TABS: 650.0000 mg | ORAL_TABLET | ORAL | Status: DC | PRN

## 2020-09-12 MED ORDER — PHENYLEPHRINE 40 MCG/ML (10ML) SYRINGE FOR IV PUSH (FOR BLOOD PRESSURE SUPPORT): 80.0000 ug | PREFILLED_SYRINGE | INTRAVENOUS | Status: DC | PRN

## 2020-09-12 MED ORDER — FENTANYL CITRATE (PF) 100 MCG/2ML IJ SOLN: 100.0000 ug | Freq: Once | INTRAMUSCULAR | Status: AC

## 2020-09-12 MED ORDER — FENTANYL CITRATE (PF) 100 MCG/2ML IJ SOLN
INTRAMUSCULAR | Status: AC
  Administered 2020-09-12: 100 ug
  Filled 2020-09-12: qty 2

## 2020-09-12 MED ORDER — MAGNESIUM SULFATE BOLUS VIA INFUSION
4.0000 g | Freq: Once | INTRAVENOUS | Status: AC
  Administered 2020-09-12: 4 g via INTRAVENOUS
  Filled 2020-09-12: qty 1000

## 2020-09-12 MED ORDER — BETAMETHASONE SOD PHOS & ACET 6 (3-3) MG/ML IJ SUSP
12.0000 mg | INTRAMUSCULAR | Status: DC
  Administered 2020-09-12: 12 mg via INTRAMUSCULAR

## 2020-09-12 MED ORDER — MAGNESIUM SULFATE 40 GM/1000ML IV SOLN
2.0000 g/h | INTRAVENOUS | Status: DC
  Administered 2020-09-12: 2 g/h via INTRAVENOUS
  Filled 2020-09-12: qty 1000

## 2020-09-12 MED ORDER — WITCH HAZEL-GLYCERIN EX PADS: 1.0000 "application " | MEDICATED_PAD | CUTANEOUS | Status: DC | PRN

## 2020-09-12 MED ORDER — ZOLPIDEM TARTRATE 5 MG PO TABS: 5.0000 mg | ORAL_TABLET | Freq: Every evening | ORAL | Status: DC | PRN

## 2020-09-12 MED ORDER — ONDANSETRON HCL 4 MG/2ML IJ SOLN: 4.0000 mg | INTRAMUSCULAR | Status: DC | PRN

## 2020-09-12 MED ORDER — ONDANSETRON HCL 4 MG PO TABS: 4.0000 mg | ORAL_TABLET | ORAL | Status: DC | PRN

## 2020-09-12 MED ORDER — AZITHROMYCIN 250 MG PO TABS: 1000.0000 mg | ORAL_TABLET | Freq: Once | ORAL | Status: DC

## 2020-09-12 MED ORDER — OXYCODONE-ACETAMINOPHEN 5-325 MG PO TABS: 1.0000 | ORAL_TABLET | ORAL | Status: DC | PRN

## 2020-09-12 MED ORDER — ONDANSETRON HCL 4 MG/2ML IJ SOLN
INTRAMUSCULAR | Status: AC
  Filled 2020-09-12: qty 2

## 2020-09-12 MED ORDER — IBUPROFEN 600 MG PO TABS
600.0000 mg | ORAL_TABLET | Freq: Four times a day (QID) | ORAL | Status: DC
  Administered 2020-09-12 – 2020-09-14 (×8): 600 mg via ORAL
  Filled 2020-09-12 (×9): qty 1

## 2020-09-12 MED ORDER — GLYBURIDE 5 MG PO TABS
5.0000 mg | ORAL_TABLET | Freq: Two times a day (BID) | ORAL | Status: DC
  Administered 2020-09-13 – 2020-09-14 (×3): 5 mg via ORAL
  Filled 2020-09-12 (×6): qty 1

## 2020-09-12 MED ORDER — ONDANSETRON HCL 4 MG/2ML IJ SOLN
4.0000 mg | Freq: Four times a day (QID) | INTRAMUSCULAR | Status: DC
Start: 2020-09-12 — End: 2020-09-12
  Administered 2020-09-12: 4 mg via INTRAVENOUS

## 2020-09-12 MED ORDER — DIPHENHYDRAMINE HCL 25 MG PO CAPS: 25.0000 mg | ORAL_CAPSULE | Freq: Four times a day (QID) | ORAL | Status: DC | PRN

## 2020-09-12 MED ORDER — CALCIUM CARBONATE ANTACID 500 MG PO CHEW: 2.0000 | CHEWABLE_TABLET | ORAL | Status: DC | PRN

## 2020-09-12 MED ORDER — DIPHENHYDRAMINE HCL 50 MG/ML IJ SOLN: 12.5000 mg | INTRAMUSCULAR | Status: DC | PRN

## 2020-09-12 MED ORDER — LACTATED RINGERS IV SOLN: INTRAVENOUS | Status: DC

## 2020-09-12 MED ORDER — OXYTOCIN BOLUS FROM INFUSION
333.0000 mL | Freq: Once | INTRAVENOUS | Status: AC
  Administered 2020-09-12: 333 mL via INTRAVENOUS

## 2020-09-12 MED ORDER — CLINDAMYCIN HCL 300 MG PO CAPS: 300.0000 mg | ORAL_CAPSULE | Freq: Three times a day (TID) | ORAL | Status: DC

## 2020-09-12 MED ORDER — BENZOCAINE-MENTHOL 20-0.5 % EX AERO: 1.0000 "application " | INHALATION_SPRAY | CUTANEOUS | Status: DC | PRN

## 2020-09-12 MED ORDER — LACTATED RINGERS IV BOLUS
1000.0000 mL | Freq: Once | INTRAVENOUS | Status: AC
  Administered 2020-09-12: 1000 mL via INTRAVENOUS

## 2020-09-12 MED ORDER — BETAMETHASONE SOD PHOS & ACET 6 (3-3) MG/ML IJ SUSP
12.0000 mg | Freq: Once | INTRAMUSCULAR | Status: AC
  Administered 2020-09-12: 12 mg via INTRAMUSCULAR
  Filled 2020-09-12: qty 5

## 2020-09-12 MED ORDER — DIBUCAINE (PERIANAL) 1 % EX OINT: 1.0000 "application " | TOPICAL_OINTMENT | CUTANEOUS | Status: DC | PRN

## 2020-09-12 MED ORDER — OXYCODONE-ACETAMINOPHEN 5-325 MG PO TABS: 2.0000 | ORAL_TABLET | ORAL | Status: DC | PRN

## 2020-09-12 MED ORDER — ONDANSETRON HCL 4 MG/2ML IJ SOLN: 4.0000 mg | Freq: Four times a day (QID) | INTRAMUSCULAR | Status: DC | PRN

## 2020-09-12 MED ORDER — PHENYLEPHRINE 40 MCG/ML (10ML) SYRINGE FOR IV PUSH (FOR BLOOD PRESSURE SUPPORT)
80.0000 ug | PREFILLED_SYRINGE | INTRAVENOUS | Status: DC | PRN
  Filled 2020-09-12: qty 10

## 2020-09-12 MED ORDER — PRENATAL MULTIVITAMIN CH: 1.0000 | ORAL_TABLET | Freq: Every day | ORAL | Status: DC

## 2020-09-12 MED ORDER — SOD CITRATE-CITRIC ACID 500-334 MG/5ML PO SOLN: 30.0000 mL | ORAL | Status: DC | PRN

## 2020-09-12 MED ORDER — FENTANYL-BUPIVACAINE-NACL 0.5-0.125-0.9 MG/250ML-% EP SOLN: 12.0000 mL/h | EPIDURAL | Status: DC | PRN

## 2020-09-12 MED ORDER — CLINDAMYCIN PHOSPHATE 900 MG/50ML IV SOLN
900.0000 mg | Freq: Three times a day (TID) | INTRAVENOUS | Status: DC
  Administered 2020-09-12: 900 mg via INTRAVENOUS
  Filled 2020-09-12 (×3): qty 50

## 2020-09-12 MED ORDER — OXYTOCIN-SODIUM CHLORIDE 30-0.9 UT/500ML-% IV SOLN
2.5000 [IU]/h | INTRAVENOUS | Status: DC
  Filled 2020-09-12: qty 500

## 2020-09-12 MED ORDER — LIDOCAINE HCL (PF) 1 % IJ SOLN
INTRAMUSCULAR | Status: DC | PRN
  Administered 2020-09-12: 10 mL via EPIDURAL
  Administered 2020-09-12: 2 mL via EPIDURAL

## 2020-09-12 NOTE — MAU Note (Addendum)
[facility] called and at bedside, VE complete, pt taken off monitor to transfer to L&D

## 2020-09-12 NOTE — MAU Note (Signed)
. °  [name] [name] is a 29 y.o. at [redacted]w[redacted]d here in MAU reporting: she went for her U/S at her appointment and as the tech was finishing her U/S she started feeling a large gush of fluid. (0840) Reports dull pelvic pain. +FM  Onset of complaint: 09/12/20  0840 Pain score: 4 Vitals:   09/12/20 0943  BP: 110/68  Pulse: 86  Resp: 16  Temp: 98.4 F (36.9 C)  SpO2: 97%     FHT:155 Lab orders placed from triage:UA/ FERN

## 2020-09-12 NOTE — Progress Notes (Deleted)
Delivery Note   Patient Name: [name] [name] DOB: 07/01/1991 MRN: [id number]  Date of admission: 09/12/2020 Delivering MD: Dale   Date of delivery: 09/12/20 Type of delivery: SVD  Newborn Data: Live born female  Birth Weight: 5 lb 4.3 oz (2390 g) APGAR: 5, 7  Newborn Delivery   Birth date/time: 09/12/2020 15:14:00 Delivery type: Vaginal, Spontaneous    [name] [name], 29 y.o., @ [redacted]w[redacted]d,  G3P1011, who was admitted for PPROM at 0900 on 10/20, was on mag for tocolysis, BMZ x1 given, mag turned off pt was complete and feeling pressure, DM2 will continue on glyburide 5mg  Po BID and metformin 500mg  PO BID, fasting and 2HPP will be checked, GBS was negative, pt did receive IV clindamycin prior to delivery,.. I was called to the room when she progressed 2+ station in the second stage of labor.  She pushed for 5/min.  She delivered a viable infant, cephalic and restituted to the LOA position over an intact perineum.  A nuchal cord   was not identified. The baby was placed on maternal abdomen while initial step of NRP were perfmored (Dry, Stimulated, and warmed). Hat placed on baby for thermoregulation. Delayed cord clamping was performed for 1 minutes.  Cord double clamped and cut.  Cord cut by father. Apgar scores were 5 and 7. Prophylactic Pitocin was started in the third stage of labor for active management. The placenta delivered spontaneously, shultz, with a 3 vessel cord and was sent to pathology.  Inspection revealed none. An examination of the vaginal vault and cervix was free from lacerations. The uterus was firm, bleeding stable. Umbilical artery blood gas were not sent.  There were no complications during the procedure.  Mom and baby skin to skin following delivery. Left in stable condition.  Maternal Info: Anesthesia: Epidural Episiotomy: No Lacerations:  No Suture Repair: No Est. Blood Loss (mL):   Newborn Info:  Baby Sex: female Circumcision: N/A Babies Name:  [name] [name] APGAR (1 MIN): 5   APGAR (5 MINS): 7   APGAR (10 MINS): 8     Mom to postpartum.  Baby to Couplet care / Skin to Skin.  DR Dillard aware  [facility], CNM, NP-C 09/12/20 3:58 PM

## 2020-09-12 NOTE — Anesthesia Procedure Notes (Signed)
Epidural Patient location during procedure: OB Start time: 09/12/2020 12:37 PM End time: 09/12/2020 12:52 PM  Staffing Anesthesiologist: Lannie Fields, DO Performed: anesthesiologist   Preanesthetic Checklist Completed: patient identified, IV checked, risks and benefits discussed, monitors and equipment checked, pre-op evaluation and timeout performed  Epidural Patient position: sitting Prep: DuraPrep and site prepped and draped Patient monitoring: continuous pulse ox, blood pressure, heart rate and cardiac monitor Approach: midline Location: L3-L4 Injection technique: LOR air  Needle:  Needle type: Tuohy  Needle gauge: 17 G Needle length: 9 cm Needle insertion depth: 8 cm Catheter type: closed end flexible Catheter size: 19 Gauge Catheter at skin depth: 13 cm Test dose: negative  Assessment Sensory level: T8 Events: blood not aspirated, injection not painful, no injection resistance, no paresthesia and negative IV test  Additional Notes Patient identified. Risks/Benefits/Options discussed with patient including but not limited to bleeding, infection, nerve damage, paralysis, failed block, incomplete pain control, headache, blood pressure changes, nausea, vomiting, reactions to medication both or allergic, itching and postpartum back pain. Confirmed with bedside nurse the patient's most recent platelet count. Confirmed with patient that they are not currently taking any anticoagulation, have any bleeding history or any family history of bleeding disorders. Patient expressed understanding and wished to proceed. All questions were answered. Sterile technique was used throughout the entire procedure. Please see nursing notes for vital signs. Test dose was given through epidural catheter and negative prior to continuing to dose epidural or start infusion. Warning signs of high block given to the patient including shortness of breath, tingling/numbness in hands, complete motor  block, or any concerning symptoms with instructions to call for help. Patient was given instructions on fall risk and not to get out of bed. All questions and concerns addressed with instructions to call with any issues or inadequate analgesia.  Reason for block:procedure for pain

## 2020-09-12 NOTE — H&P (Addendum)
[name] [name] is a 29 y.o. female, G3P1011, IUP at 33.1 weeks, presenting for PPROM clear at 75 on 10/20 after her [place] today. Pt endorses feeling mild cxt with dull pelvis pressure. Pt endorse + Fm.  Denies vaginal bleeding.  Last [place] with [facility] today 10/20 EFW 5.1lbs, 63%, AFI prior to rupture was 14.35, cephalic, anterior placenta, BPP 8/8, reactive NST. DM2: On glyburide 5mg  BID.  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   Patient Active Problem List   Diagnosis Date Noted  . Pre-existing type 2 diabetes mellitus during pregnancy, antepartum 05/31/2020  . Bronchitis, acute, with bronchospasm 08/22/2012  . Fever 08/22/2012  . Tobacco abuse 08/22/2012   Medications Prior to Admission  Medication Sig Dispense Refill Last Dose  . glyBURIDE (DIABETA) 5 MG tablet Take 5 mg by mouth 2 (two) times daily.   09/12/2020 at Unknown time  . Prenatal Vit-Fe Fumarate-FA (MULTIVITAMIN-PRENATAL) 27-0.8 MG TABS tablet Take 1 tablet by mouth daily at 12 noon.   09/12/2020 at Unknown time  . acetaminophen (TYLENOL) 500 MG tablet Take 1 tablet (500 mg total) by mouth every 6 (six) hours as needed. 30 tablet 0   . albuterol (PROVENTIL HFA;VENTOLIN HFA) 108 (90 Base) MCG/ACT inhaler Inhale 2 puffs into the lungs every 6 (six) hours as needed for wheezing or shortness of breath. (Patient not taking: Reported on 06/06/2020) 1 Inhaler 0   . clindamycin (CLEOCIN) 150 MG capsule Take 1 capsule (150 mg total) by mouth every 6 (six) hours. (Patient not taking: Reported on 07/04/2020) 28 capsule 0   . metFORMIN (GLUCOPHAGE) 500 MG tablet Take by mouth 2 (two) times daily with a meal. (Patient not taking: Reported on 07/25/2020)       Past Medical History:  Diagnosis Date  . Diabetes mellitus without complication (HCC)      No current facility-administered medications on file prior to encounter.   Current Outpatient Medications  on File Prior to Encounter  Medication Sig Dispense Refill  . glyBURIDE (DIABETA) 5 MG tablet Take 5 mg by mouth 2 (two) times daily.    . Prenatal Vit-Fe Fumarate-FA (MULTIVITAMIN-PRENATAL) 27-0.8 MG TABS tablet Take 1 tablet by mouth daily at 12 noon.    Marland Kitchen acetaminophen (TYLENOL) 500 MG tablet Take 1 tablet (500 mg total) by mouth every 6 (six) hours as needed. 30 tablet 0  . albuterol (PROVENTIL HFA;VENTOLIN HFA) 108 (90 Base) MCG/ACT inhaler Inhale 2 puffs into the lungs every 6 (six) hours as needed for wheezing or shortness of breath. (Patient not taking: Reported on 06/06/2020) 1 Inhaler 0  . clindamycin (CLEOCIN) 150 MG capsule Take 1 capsule (150 mg total) by mouth every 6 (six) hours. (Patient not taking: Reported on 07/04/2020) 28 capsule 0  . metFORMIN (GLUCOPHAGE) 500 MG tablet Take by mouth 2 (two) times daily with a meal. (Patient not taking: Reported on 07/25/2020)       Allergies  Allergen Reactions  . Penicillins     History of present pregnancy: Pt Info/Preference:  Screening/Consents:  Labs:   EDD: Estimated Date of Delivery: 10/30/20  Establised: Patient's last menstrual period was 01/09/2020.  Anatomy Scan: Date: 06/06/2020 Placenta Location: anterior Genetic Screen: Panoroma:INconclusive AFP: Neg First Tri: Quad: Horizon: SMA carrier positive.    Office: [facility]            First PNV: 10.3 wg Blood Type  AB+  Language: english Last PNV:  33.2 wg Rhogam  Neg  Flu Vaccine:  declined   Antibody  Neg  TDaP vaccine declined   GTT: Early: 10.3 Third Trimester: Not performed due to dx of DM2  Feeding Plan: pump BTL: no Rubella:  Immune  Contraception: ??? VBAC: no RPR:   NR  Circumcision: Poss Female gender   HBsAg:  Neg  Pediatrician:  ???   HIV: Non Reactive (04/27 1355)   Prenatal Classes: no Additional [place]: Yes see HPI on 10/20 today for [place] results GBS:  Unknown(For PCN allergy, check sensitivities)       Chlamydia: neg    MFM Referral/Consult:  GC: neg  Support Person:  parnter   PAP: ???  Pain Management: epidural Neonatologist Referral:  Hgb Electrophoresis:  AA  Birth Plan: None   Hgb NOB: 15.1    28W: ???   OB History    Gravida  3   Para  1   Term  1   Preterm      AB  1   Living  1     SAB  1   TAB      Ectopic      Multiple      Live Births  1          Past Medical History:  Diagnosis Date  . Diabetes mellitus without complication [facility])    Past Surgical History:  Procedure Laterality Date  . NO PAST SURGERIES     Family History: family history includes Cancer in her maternal grandmother; Diabetes in her mother. Social History:  reports that she has quit smoking. Her smoking use included cigarettes. She has never used smokeless tobacco. She reports previous alcohol use. She reports previous drug use. Drug: Marijuana.   Prenatal Transfer Tool  Maternal Diabetes: Yes:  Diabetes Type:  Pre-pregnancy, Insulin/Medication controlled Genetic Screening: Abnormal:  Results: Other:SMA carrier Maternal Ultrasounds/Referrals: Normal Fetal Ultrasounds or other Referrals:  Referred to Materal Fetal Medicine  Maternal Substance Abuse:  No Significant Maternal Medications:  Meds include: Other:  Glyburide Significant Maternal Lab Results: Other: GBS unknown and pending  ROS:  Review of Systems  Constitutional: Negative.   HENT: Negative.   Respiratory: Negative.   Cardiovascular: Negative.   Gastrointestinal: Positive for abdominal pain.  Genitourinary:       Vaginal leakage  Musculoskeletal: Negative.   Skin: Negative.   Neurological: Negative.   Endo/Heme/Allergies: Negative.   Psychiatric/Behavioral: Negative.      Physical Exam: BP 110/68   Pulse 86   Temp 98.4 F (36.9 C)   Resp 16   Ht 5\' 5"  (1.651 m)   Wt 108 kg   LMP 01/09/2020   SpO2 97%   BMI 39.61 kg/m   Physical Exam Vitals and nursing note reviewed. Exam conducted with a chaperone present.  HENT:     Head: Normocephalic.     Nose: Nose normal.      Mouth/Throat:     Mouth: Mucous membranes are moist.  Eyes:     Pupils: Pupils are equal, round, and reactive to light.  Cardiovascular:     Rate and Rhythm: Normal rate and regular rhythm.     Pulses: Normal pulses.     Heart sounds: Normal heart sounds.  Pulmonary:     Effort: Pulmonary effort is normal.     Breath sounds: Normal breath sounds.  Abdominal:     General: Bowel sounds are normal.  Genitourinary:    Comments: Pelvis adequate , gravida uterus Musculoskeletal:  General: Normal range of motion.  Skin:    General: Skin is warm.     Capillary Refill: Capillary refill takes less than 2 seconds.  Neurological:     General: No focal deficit present.     Mental Status: She is alert.  Psychiatric:        Mood and Affect: Mood normal.        Behavior: Behavior normal.      NST: FHR baseline 155 bpm, Variability: moderate, Accelerations:present, Decelerations:  Absent= Cat 1/Reactive UC:   irregular, every 2-3 minutes, lasting 20-50 seconds SVE:   Dilation: 1 Effacement (%): 70 Station: -3 Exam by:: Judeth Horn NP, vertex verified by fetal sutures.  Leopold's: Position vertex, EFW 5lbs via leopold's.   Labs: Results for orders placed or performed during the hospital encounter of 09/12/20 (from the past 24 hour(s))  Urinalysis, Routine w reflex microscopic Urine, Clean Catch     Status: Abnormal   Collection Time: 09/12/20  9:34 AM  Result Value Ref Range   Color, Urine YELLOW YELLOW   APPearance HAZY (A) CLEAR   Specific Gravity, Urine 1.021 1.005 - 1.030   pH 6.0 5.0 - 8.0   Glucose, UA >=500 (A) NEGATIVE mg/dL   Hgb urine dipstick MODERATE (A) NEGATIVE   Bilirubin Urine NEGATIVE NEGATIVE   Ketones, ur NEGATIVE NEGATIVE mg/dL   Protein, ur 161 (A) NEGATIVE mg/dL   Nitrite NEGATIVE NEGATIVE   Leukocytes,Ua NEGATIVE NEGATIVE   RBC / HPF 11-20 0 - 5 RBC/hpf   WBC, UA 6-10 0 - 5 WBC/hpf   Bacteria, UA RARE (A) NONE SEEN   Squamous Epithelial / LPF  11-20 0 - 5   Mucus PRESENT   Fern Test     Status: Normal   Collection Time: 09/12/20  9:58 AM  Result Value Ref Range   POCT Fern Test Positive = ruptured amniotic membanes     Imaging:  [place] MFM FETAL BPP WO NON STRESS  Result Date: 09/12/2020 ----------------------------------------------------------------------  OBSTETRICS REPORT                       (Signed Final 09/12/2020 08:58 am) ---------------------------------------------------------------------- Patient Info  ID #:       [id number]                          D.O.B.:  Feb 28, 1991 (29 yrs)  Name:       [name] [name]                   Visit Date: 09/12/2020 08:16 am ---------------------------------------------------------------------- Performed By  Attending:        Noralee Space MD        Ref. Address:     [facility]                                                             [address]                                                             [address]                                                             [place], [place]                                                             [address]  Performed By:     Sandi Mealy        Location[facility]  Referred By:      Jaymes Graff                    MD ---------------------------------------------------------------------- Orders  #  Description                           Code        Ordered By  1  [place] MFM FETAL BPP WO NON  57017.79    CORENTHIAN     STRESS                                            BOOKER  2  [place] MFM OB FOLLOW UP                    39030.09    Lin Landsman ----------------------------------------------------------------------  #  Order #                     Accession #                Episode #  1  [id number]                   [id number]                 [id number]  2  [id number]                   [id number]                 [id number] ---------------------------------------------------------------------- Indications  Pre-existing diabetes, type 2, in pregnancy,   O24.112  second trimester  Abnormal chromosomal and genetic finding       O28.5  on antenatal screening of mother (NIPS, low  FF, SMA Carrier)  Obesity complicating pregnancy, second         O99.212  trimester  Tobacco use complicating pregnancy,            O99.332  second trimester  Encounter for other antenatal screening        Z36.2  follow-up  [redacted] weeks gestation of pregnancy                Z3A.33 ---------------------------------------------------------------------- Vital Signs                                                 Height:        5'6" ---------------------------------------------------------------------- Fetal Evaluation  Num Of Fetuses:         1  Fetal Heart Rate(bpm):  166  Cardiac Activity:       Observed  Presentation:           Cephalic  Placenta:               Anterior  P. Cord Insertion:      Visualized  Amniotic Fluid  AFI FV:      Within normal limits  AFI Sum(cm)     %Tile       Largest Pocket(cm)  14.35           50          4.76  RUQ(cm)       RLQ(cm)       LUQ(cm)        LLQ(cm)  3.18  4.68          1.73           4.76 ---------------------------------------------------------------------- Biophysical Evaluation  Amniotic F.V:   Pocket => 2 cm             F. Tone:        Observed  F. Movement:    Observed                   Score:          8/8  F. Breathing:   Observed ---------------------------------------------------------------------- Biometry  BPD:        76  mm     G. Age:  30w 3d         1.1  %    CI:        75.34   %    70 - 86                                                          FL/HC:      21.7   %    19.9 - 21.5  HC:      277.7  mm     G. Age:  30w 3d        < 1  %    HC/AC:      0.85        0.96 - 1.11  AC:      325.3  mm     G. Age:  36w 3d       > 99  %    FL/BPD:     79.2   %    71 - 87  FL:       60.2  mm     G. Age:  31w 2d          5  %    FL/AC:      18.5   %    20 - 24  HUM:      52.4  mm     G. Age:  30w 4d          6  %  Est. FW:    2286  gm      5 lb 1 oz     63  % ---------------------------------------------------------------------- OB History  Gravidity:    2         Term:   1  Living:       1 ---------------------------------------------------------------------- Gestational Age  U/S Today:     32w 1d                                        EDD:   11/06/20  Best:          33w 1d     Det. By:  Previous Ultrasound      EDD:   10/30/20                                      (03/20/20) ---------------------------------------------------------------------- Anatomy  Cranium:  Appears normal         Aortic Arch:            Previously seen  Cavum:                 Appears normal         Ductal Arch:            Previously seen  Ventricles:            Previously seen        Diaphragm:              Appears normal  Choroid Plexus:        Previously seen        Stomach:                Appears normal, left                                                                        sided  Cerebellum:            Previously seen        Abdomen:                Appears normal  Posterior Fossa:       Previously seen        Abdominal Wall:         Previously seen  Nuchal Fold:           Not applicable (>20    Cord Vessels:           Previously seen                         wks GA)  Face:                  Orbits and profile     Kidneys:                Appear normal                         previously seen  Lips:                  Previously seen        Bladder:                Appears normal   Thoracic:              Previously seen        Spine:                  Previously seen  Heart:                 Previously seen        Upper Extremities:      Previously seen  RVOT:                  Previously seen        Lower Extremities:      Previously seen  LVOT:                  Previously seen  Other:  Heels/feet and open hands/5th digits previously visualized. ---------------------------------------------------------------------- Impression  Pregestational diabetes. Patient takes glyburide 5 mg twice  daily and reports her blood glucose levels are within normal  range.  Fetal growth is appropriate for gestational age .HC  measurement is at the -1 SD (normal). Amniotic fluid is  normal and good fetal activity is seen .Antenatal testing is  reassuring. BPP 8/8.  We reassured the patient of the findings.  After ultrasound, before leaving our office, the patient  reported leakage of amniotic fluid. We performed ultrasound  again. Good fetal heart activity is seen (159 bpm). AFI=19  cm. Cephalic presentation.  Patient was sent over to the MAU for evaluation.  MAU team was informed. ---------------------------------------------------------------------- Recommendations  Weekly BPP till delivery if PPROM is ruled out. ----------------------------------------------------------------------                  Noralee Space, MD Electronically Signed Final Report   09/12/2020 08:58 am ----------------------------------------------------------------------  [place] MFM OB FOLLOW UP  Result Date: 09/12/2020 ----------------------------------------------------------------------  OBSTETRICS REPORT                       (Signed Final 09/12/2020 08:58 am) ---------------------------------------------------------------------- Patient Info  ID #:       [id number]                          D.O.B.:  [birth year] (29 yrs)  Name:       [name] [name]                   Visit Date: 09/12/2020 08:16 am  ---------------------------------------------------------------------- Performed By  Attending:        Noralee Space MD        Ref. Address:     [facility]                                                             Gynecology                                                             [address]  [address]                                                             [place], [place]                                                             [address]  Performed By:     Sandi Mealy        Location:         [facility]  Referred By:      Jaymes Graff                    MD ---------------------------------------------------------------------- Orders  #  Description                           Code        Ordered By  1  [place] MFM FETAL BPP WO NON               76819.01    CORENTHIAN     STRESS                                            BOOKER  2  [place] MFM OB FOLLOW UP                   13244.01    Lin Landsman ----------------------------------------------------------------------  #  Order #                     Accession #  Episode #  1  [id number]                   [id number]                 [id number]  2  [id number]                   [id number]                 [id number] ---------------------------------------------------------------------- Indications  Pre-existing diabetes, type 2, in pregnancy,   O24.112  second trimester  Abnormal chromosomal and genetic finding       O28.5  on antenatal screening of mother (NIPS, low  FF, SMA  Carrier)  Obesity complicating pregnancy, second         O99.212  trimester  Tobacco use complicating pregnancy,            O99.332  second trimester  Encounter for other antenatal screening        Z36.2  follow-up  [redacted] weeks gestation of pregnancy                Z3A.33 ---------------------------------------------------------------------- Vital Signs                                                 Height:        5'6" ---------------------------------------------------------------------- Fetal Evaluation  Num Of Fetuses:         1  Fetal Heart Rate(bpm):  166  Cardiac Activity:       Observed  Presentation:           Cephalic  Placenta:               Anterior  P. Cord Insertion:      Visualized  Amniotic Fluid  AFI FV:      Within normal limits  AFI Sum(cm)     %Tile       Largest Pocket(cm)  14.35           50          4.76  RUQ(cm)       RLQ(cm)       LUQ(cm)        LLQ(cm)  3.18          4.68          1.73           4.76 ---------------------------------------------------------------------- Biophysical Evaluation  Amniotic F.V:   Pocket => 2 cm             F. Tone:        Observed  F. Movement:    Observed                   Score:          8/8  F. Breathing:   Observed ---------------------------------------------------------------------- Biometry  BPD:        76  mm     G. Age:  30w 3d        1.1  %    CI:        75.34   %    70 - 86  FL/HC:      21.7   %    19.9 - 21.5  HC:      277.7  mm     G. Age:  30w 3d        < 1  %    HC/AC:      0.85        0.96 - 1.11  AC:      325.3  mm     G. Age:  36w 3d       > 99  %    FL/BPD:     79.2   %    71 - 87  FL:       60.2  mm     G. Age:  31w 2d          5  %    FL/AC:      18.5   %    20 - 24  HUM:      52.4  mm     G. Age:  30w 4d          6  %  Est. FW:    2286  gm      5 lb 1 oz     63  % ---------------------------------------------------------------------- OB History  Gravidity:    2         Term:   1  Living:        1 ---------------------------------------------------------------------- Gestational Age  U/S Today:     32w 1d                                        EDD:   11/06/20  Best:          33w 1d     Det. By:  Previous Ultrasound      EDD:   10/30/20                                      (03/20/20) ---------------------------------------------------------------------- Anatomy  Cranium:               Appears normal         Aortic Arch:            Previously seen  Cavum:                 Appears normal         Ductal Arch:            Previously seen  Ventricles:            Previously seen        Diaphragm:              Appears normal  Choroid Plexus:        Previously seen        Stomach:                Appears normal, left  sided  Cerebellum:            Previously seen        Abdomen:                Appears normal  Posterior Fossa:       Previously seen        Abdominal Wall:         Previously seen  Nuchal Fold:           Not applicable (>20    Cord Vessels:           Previously seen                         wks GA)  Face:                  Orbits and profile     Kidneys:                Appear normal                         previously seen  Lips:                  Previously seen        Bladder:                Appears normal  Thoracic:              Previously seen        Spine:                  Previously seen  Heart:                 Previously seen        Upper Extremities:      Previously seen  RVOT:                  Previously seen        Lower Extremities:      Previously seen  LVOT:                  Previously seen  Other:  Heels/feet and open hands/5th digits previously visualized. ---------------------------------------------------------------------- Impression  Pregestational diabetes. Patient takes glyburide 5 mg twice  daily and reports her blood glucose levels are within normal  range.  Fetal growth is appropriate for gestational age .HC   measurement is at the -1 SD (normal). Amniotic fluid is  normal and good fetal activity is seen .Antenatal testing is  reassuring. BPP 8/8.  We reassured the patient of the findings.  After ultrasound, before leaving our office, the patient  reported leakage of amniotic fluid. We performed ultrasound  again. Good fetal heart activity is seen (159 bpm). AFI=19  cm. Cephalic presentation.  Patient was sent over to the MAU for evaluation.  MAU team was informed. ---------------------------------------------------------------------- Recommendations  Weekly BPP till delivery if PPROM is ruled out. ----------------------------------------------------------------------                  Noralee Space, MD Electronically Signed Final Report   09/12/2020 08:58 am ----------------------------------------------------------------------  [place] MFM OB FOLLOW UP  Result Date: 08/16/2020 ----------------------------------------------------------------------  OBSTETRICS REPORT                        (Signed Final 08/16/2020 12:07 am) ---------------------------------------------------------------------- Patient Info  ID #:  [id number]                          D.O.B.:  October 28, 1991 (29 yrs)  Name:       [name] [name]                   Visit Date: 08/15/2020 02:49 pm ---------------------------------------------------------------------- Performed By  Attending:        Lin Landsman      Ref. Address:      [facility]                    MD                                                              Obstetrics &                                                              Gynecology                                                              [address]                                                              [address]                                                              [place], [place]                                                               [address]  Performed By:     Lenise Arena        Location:          [facility]  Referred By:      Jaymes Graff                    MD ---------------------------------------------------------------------- Orders  #  Description                           Code        Ordered By  1  [place] MFM OB FOLLOW UP                   01027.25    Lin Landsman ----------------------------------------------------------------------  #  Order #                     Accession #                Episode #  1  [id number]                   [id number]                 [id number] ---------------------------------------------------------------------- Indications  Pre-existing diabetes, type 2, in pregnancy,    O24.112  second trimester  Abnormal chromosomal and genetic finding        O28.5  on antenatal screening of mother (NIPS, low  FF, SMA Carrier)  Obesity complicating pregnancy, second          O99.212  trimester  Tobacco use complicating pregnancy,             O99.332  second trimester  Encounter for other antenatal screening         Z36.2  follow-up  [redacted] weeks gestation of pregnancy                 Z3A.29 ---------------------------------------------------------------------- Vital Signs                                                 Height:        5'6" ---------------------------------------------------------------------- Fetal Evaluation  Num Of Fetuses:          1  Fetal Heart Rate(bpm):   159  Cardiac Activity:        Observed  Presentation:            Cephalic  Placenta:                Anterior  P. Cord Insertion:       Previously Visualized  Amniotic Fluid  AFI FV:      Within normal limits  AFI Sum(cm)     %Tile       Largest Pocket(cm)  19.42           76           5.77  RUQ(cm)       RLQ(cm)       LUQ(cm)        LLQ(cm)  5.77          5.33          3.57           4.75 ---------------------------------------------------------------------- Biometry  BPD:      67.8  mm     G. Age:  27w 2d        2.7  %    CI:        76.88   %    70 - 86                                                          FL/HC:       22.0  %    19.6 - 20.8  HC:      244.9  mm     G. Age:  26w 4d        < 1  %    HC/AC:       0.92       0.99 - 1.21  AC:      265.3  mm     G. Age:  30w 5d         85  %    FL/BPD:      79.5  %    71 - 87  FL:       53.9  mm     G. Age:  28w 4d         20  %    FL/AC:       20.3  %    20 - 24  Est. FW:    1370   gm          3 lb     43  % ---------------------------------------------------------------------- OB History  Gravidity:    2         Term:   1  Living:       1 ---------------------------------------------------------------------- Gestational Age  U/S Today:     28w 2d                                        EDD:   11/05/20  Best:          29w 1d     Det. By:  Previous Ultrasound      EDD:   10/30/20                                      (03/20/20) ---------------------------------------------------------------------- Anatomy  Cranium:  Appears normal         Aortic Arch:            Previously seen  Cavum:                 Appears normal         Ductal Arch:            Previously seen  Ventricles:            Appears normal         Diaphragm:              Previously seen  Choroid Plexus:        Previously seen        Stomach:                Appears normal, left                                                                        sided  Cerebellum:            Previously seen        Abdomen:                Appears normal  Posterior Fossa:       Previously seen        Abdominal Wall:         Previously seen  Nuchal Fold:           Previously seen        Cord Vessels:           Previously seen  Face:                  Orbits and profile     Kidneys:                 Appear normal                         previously seen  Lips:                  Previously seen        Bladder:                Appears normal  Thoracic:              Previously seen        Spine:                  Limited views prev                                                                        seen  Heart:                 Previously seen        Upper Extremities:      Previously seen  RVOT:  Previously seen        Lower Extremities:      Previously seen  LVOT:                  Previously seen  Other:  Heels/feet and open hands/5th digits previously visualized. ---------------------------------------------------------------------- Cervix Uterus Adnexa  Cervix  Not visualized (advanced GA >24wks) ---------------------------------------------------------------------- Impression  Follow up growth due toT2DM  Normal interval growth with measurements consistent with  dates  Good fetal movement and amniotic fluid volume ---------------------------------------------------------------------- Recommendations  Follow up BPP in 3 weeks  Repeat growth in 4 weeks. ----------------------------------------------------------------------               Lin Landsman, MD Electronically Signed Final Report   08/16/2020 12:07 am ----------------------------------------------------------------------   MAU Course: Orders Placed This Encounter  Procedures  . Urinalysis, Routine w reflex microscopic Urine, Clean Catch  . Fern Test   No orders of the defined types were placed in this encounter.   Assessment/Plan: Laurina Fischl is a 29 y.o. female, G3P1011, IUP at 33.1 weeks, presenting for PPROM clear at 63 on 10/20 after her [place] today. Pt endorses feeling mild cxt with dull pelvis pressure. Pt endorse + Fm.  Denies vaginal bleeding.  Last [place] with [facility] today 10/20 EFW 5.1lbs, 63%, AFI prior to rupture was 14.35, cephalic, anterior placenta, BPP 8/8, reactive NST. DM2: On glyburide 5mg   BID.  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   FWB: Cat 1 Fetal Tracing.   Plan: Admit to ANtenatal per consult with Dr Normand Sloop Routine ante [facility] orders Low carb diabetic diet Covid pending GC/C, UC, GBS PCR Pending Magnesium for tocolysis until BMZ given Start BMZ, delay delivery if possible for BMZ, then expectant management Start Latency abx with pcn allergy, start x1 dose of PO 1000mg  azithromycin with IV vancomyacin 540mg  x2 doses with clindamycin 900mg  IV QgH x6 does followed by clindamycin 300mg  PO TID for 15 doses. Will discontinue if pt delivers.  Continue glyburide 5mg  BID with CBG fasting and 2 H PP, will consult with dietitian if indicated for elevated CBG during BMZ administration.  NICU consult called and reported off.     Dale Bolan NP-C, CNM, MSN 09/12/2020, 10:32 AM

## 2020-09-12 NOTE — Consult Note (Addendum)
Asked on behalf of Dr.Dillard to provide prenatal consultation for 29 y.o. G3 P1-0-1-1 mother who is now 33.[redacted] weeks EGA, with pregnancy complicated by PROM.  She has Type 2 DM and is on glyburide. Being treated with betamethasone (first dose 1045 today), MgSO4, and latency antibiotics.  Discussed with patient and FOB usual expectations for preterm infant at 66 - [redacted] weeks gestation, including possible needs for DR resuscitation, respiratory support, IV access.  Explained family-centered care approach including parent participation in rounds, decision-making, and also presented usual criteria for discharge. Projected possible length of stay in NICU until 37 - [redacted] wks EGA.  Discussed advantages of feeding with mother's milk and possible use of donor milk as "bridge" if needed until her supply is sufficient.  Patient and FOB were attentive, had appropriate questions, and were appreciative of my input.  Thank you for consulting Neonatology.  Total time 25 minutes, face-to-face time 15 minutes  JWimmer, MD

## 2020-09-12 NOTE — MAU Note (Signed)
[facility] CNM in room with pt discussing plan of care.Questions encouraged and answered

## 2020-09-12 NOTE — Anesthesia Preprocedure Evaluation (Signed)
Anesthesia Evaluation  Patient identified by MRN, date of birth, ID band Patient awake    Reviewed: Allergy & Precautions, NPO status , Patient's Chart, lab work & pertinent test results  Airway Mallampati: II  TM Distance: >3 FB Neck ROM: Full    Dental no notable dental hx.    Pulmonary former smoker,    Pulmonary exam normal breath sounds clear to auscultation       Cardiovascular hypertension, Normal cardiovascular exam Rhythm:Regular Rate:Normal     Neuro/Psych negative neurological ROS  negative psych ROS   GI/Hepatic negative GI ROS, Neg liver ROS,   Endo/Other  diabetes, Type 2, Oral Hypoglycemic AgentsMorbid obesityBMI 40  Renal/GU negative Renal ROS  negative genitourinary   Musculoskeletal negative musculoskeletal ROS (+)   Abdominal (+) + obese,   Peds negative pediatric ROS (+)  Hematology negative hematology ROS (+) hct 42.9, plt 256   Anesthesia Other Findings   Reproductive/Obstetrics (+) Pregnancy G3P1 at 33wks, PPROM Had epidural with prior delivery and worked well                             Anesthesia Physical Anesthesia Plan  ASA: III and emergent  Anesthesia Plan: Epidural   Post-op Pain Management:    Induction:   PONV Risk Score and Plan: 2  Airway Management Planned: Natural Airway  Additional Equipment: None  Intra-op Plan:   Post-operative Plan:   Informed Consent: I have reviewed the patients History and Physical, chart, labs and discussed the procedure including the risks, benefits and alternatives for the proposed anesthesia with the patient or authorized representative who has indicated his/her understanding and acceptance.       Plan Discussed with:   Anesthesia Plan Comments:         Anesthesia Quick Evaluation

## 2020-09-12 NOTE — Progress Notes (Unsigned)
39 Family member came out of room in MFM.  Reported she felt [name] [name]'s water broke when she stood up to go to bathroom.   Clear fluid noted by RN with saturated clothing.  AFI check by Dr. Judeth Cornfield.  Pt reports to continue to have trickling of clear fluid.  Reports good fetal movement.  No vaginal bleeding.  Instructed pt to go to MAU for further evaluation.

## 2020-09-12 NOTE — MAU Provider Note (Signed)
Chief Complaint:  Rupture of Membranes   First Provider Initiated Contact with Patient 09/12/20 1010     HPI: Stanislawa Gaffin is a 29 y.o. G3P1011 at [redacted]w[redacted]d who presents to maternity admissions reporting  Leaking of fluid. Was in MFM for BPP this morning due to T2DM; states immediately after her ultrasound she had a gush of fluid. Leaking has continued. Since then has been having painful contractions. Denies vaginal bleeding. Good fetal movement.   Location: abomen Quality: contractions Severity: 6/10 in pain scale Duration: 2 hours Timing: every 2-3 minutes  Modifying factors: none Associated signs and symptoms: leaking fluid  Pregnancy Course: [facility]. T2DM on glyburide  Past Medical History:  Diagnosis Date  . Diabetes mellitus without complication (HCC)    OB History  Gravida Para Term Preterm AB Living  3 1 1   1 1   SAB TAB Ectopic Multiple Live Births  1       1    # Outcome Date GA Lbr Len/2nd Weight Sex Delivery Anes PTL Lv  3 Current           2 Term 08/01/09    F Vag-Spont   LIV  1 SAB  [redacted]w[redacted]d          Past Surgical History:  Procedure Laterality Date  . NO PAST SURGERIES     Family History  Problem Relation Age of Onset  . Diabetes Mother   . Cancer Maternal Grandmother        Breast   Social History   Tobacco Use  . Smoking status: Former Smoker    Types: Cigarettes  . Smokeless tobacco: Never Used  Vaping Use  . Vaping Use: Never used  Substance Use Topics  . Alcohol use: Not Currently  . Drug use: Not Currently    Types: Marijuana   Allergies  Allergen Reactions  . Penicillins Rash   Medications Prior to Admission  Medication Sig Dispense Refill Last Dose  . glyBURIDE (DIABETA) 5 MG tablet Take 5 mg by mouth 2 (two) times daily.   09/12/2020 at Unknown time  . Prenatal Vit-Fe Fumarate-FA (MULTIVITAMIN-PRENATAL) 27-0.8 MG TABS tablet Take 1 tablet by mouth daily at 12 noon.   09/12/2020 at Unknown time  . acetaminophen (TYLENOL) 500 MG tablet Take 1  tablet (500 mg total) by mouth every 6 (six) hours as needed. 30 tablet 0   . albuterol (PROVENTIL HFA;VENTOLIN HFA) 108 (90 Base) MCG/ACT inhaler Inhale 2 puffs into the lungs every 6 (six) hours as needed for wheezing or shortness of breath. (Patient not taking: Reported on 06/06/2020) 1 Inhaler 0   . clindamycin (CLEOCIN) 150 MG capsule Take 1 capsule (150 mg total) by mouth every 6 (six) hours. (Patient not taking: Reported on 07/04/2020) 28 capsule 0   . metFORMIN (GLUCOPHAGE) 500 MG tablet Take by mouth 2 (two) times daily with a meal. (Patient not taking: Reported on 07/25/2020)       I have reviewed patient's Past Medical Hx, Surgical Hx, Family Hx, Social Hx, medications and allergies.   ROS:  Review of Systems  Constitutional: Negative.   Gastrointestinal: Positive for abdominal pain.  Genitourinary: Positive for vaginal discharge. Negative for vaginal bleeding.    Physical Exam   Patient Vitals for the past 24 hrs:  BP Temp Pulse Resp SpO2 Height Weight  09/12/20 0943 110/68 98.4 F (36.9 C) 86 16 97 % 5\' 5"  (1.651 m) 108 kg    Constitutional: Well-developed, well-nourished female in no acute distress.  Cardiovascular: normal rate & rhythm, no murmur Respiratory: normal effort, lung sounds clear throughout GI: Abd soft, non-tender, gravid appropriate for gestational age. Pos BS x 4 MS: Extremities nontender, no edema, normal ROM Neurologic: Alert and oriented x 4.  GU:   Moderate amount of clear fluid leaking from introitus  Dilation: 1 Effacement (%): 70 Cervical Position: Middle Station: -3 Exam by:: Judeth Horn NP  Fetal Tracing:  Baseline: 150 Variability: moderate Accelerations: 15x15 Decelerations: none  Toco: Q2-5 minutes    Labs: Results for orders placed or performed during the hospital encounter of 09/12/20 (from the past 24 hour(s))  Urinalysis, Routine w reflex microscopic Urine, Clean Catch     Status: Abnormal   Collection Time: 09/12/20  9:34  AM  Result Value Ref Range   Color, Urine YELLOW YELLOW   APPearance HAZY (A) CLEAR   Specific Gravity, Urine 1.021 1.005 - 1.030   pH 6.0 5.0 - 8.0   Glucose, UA >=500 (A) NEGATIVE mg/dL   Hgb urine dipstick MODERATE (A) NEGATIVE   Bilirubin Urine NEGATIVE NEGATIVE   Ketones, ur NEGATIVE NEGATIVE mg/dL   Protein, ur 161 (A) NEGATIVE mg/dL   Nitrite NEGATIVE NEGATIVE   Leukocytes,Ua NEGATIVE NEGATIVE   RBC / HPF 11-20 0 - 5 RBC/hpf   WBC, UA 6-10 0 - 5 WBC/hpf   Bacteria, UA RARE (A) NONE SEEN   Squamous Epithelial / LPF 11-20 0 - 5   Mucus PRESENT   Fern Test     Status: Normal   Collection Time: 09/12/20  9:58 AM  Result Value Ref Range   POCT Fern Test Positive = ruptured amniotic membanes   CBC     Status: None   Collection Time: 09/12/20 10:25 AM  Result Value Ref Range   WBC 8.1 4.0 - 10.5 K/uL   RBC 4.83 3.87 - 5.11 MIL/uL   Hemoglobin 14.4 12.0 - 15.0 g/dL   HCT 09.6 36 - 46 %   MCV 88.8 80.0 - 100.0 fL   MCH 29.8 26.0 - 34.0 pg   MCHC 33.6 30.0 - 36.0 g/dL   RDW 04.5 40.9 - 81.1 %   Platelets 256 150 - 400 K/uL   nRBC 0.0 0.0 - 0.2 %  Respiratory Panel by RT PCR (Flu A&B, Covid) - Nasopharyngeal Swab     Status: None   Collection Time: 09/12/20 10:35 AM   Specimen: Nasopharyngeal Swab  Result Value Ref Range   SARS Coronavirus 2 by RT PCR NEGATIVE NEGATIVE   Influenza A by PCR NEGATIVE NEGATIVE   Influenza B by PCR NEGATIVE NEGATIVE  Type and screen [facility]     Status: None (Preliminary result)   Collection Time: 09/12/20 10:43 AM  Result Value Ref Range   ABO/RH(D) PENDING    Antibody Screen PENDING    Sample Expiration      09/15/2020,2359 Performed at [facility], [address] [place], [place] [address]     Imaging:  [place] MFM FETAL BPP WO NON STRESS  Result Date: 09/12/2020 ----------------------------------------------------------------------  OBSTETRICS REPORT                       (Signed Final 09/12/2020 08:58 am)  ---------------------------------------------------------------------- Patient Info  ID #:       [id number]                          D.O.B.:  22-Jun-1991 (29 yrs)  Name:       [facility]                   Visit Date: 09/12/2020 08:16 am ---------------------------------------------------------------------- Performed By  Attending:        Noralee Space MD        Ref. Address:     [facility] &                                                             Gynecology                                                             [address]                                                             [address]                                                             [place], [place]                                                             [address]  Performed By:     Sandi Mealy        Location[facility]                                     [facility]  Referred By:      Jaymes Graff                    MD ---------------------------------------------------------------------- Orders  #  Description                           Code        Ordered By  1  [place] MFM FETAL BPP WO NON               76819.01    CORENTHIAN     STRESS                                            BOOKER  2  [place] MFM OB FOLLOW UP                   76816.01    Lin Landsman ----------------------------------------------------------------------  #  Order #                     Accession #                Episode #  1  [id number]                   [id number]                 [id number]  2  [id number]                   [id number]                 [id number]  ---------------------------------------------------------------------- Indications  Pre-existing diabetes, type 2, in pregnancy,   O24.112  second trimester  Abnormal chromosomal and genetic finding       O28.5  on antenatal screening of mother (NIPS, low  FF, SMA Carrier)  Obesity complicating pregnancy, second         O99.212  trimester  Tobacco use complicating pregnancy,            O99.332  second trimester  Encounter for other antenatal screening        Z36.2  follow-up  [redacted] weeks gestation of pregnancy                Z3A.33 ---------------------------------------------------------------------- Vital Signs                                                 Height:        5'6" ---------------------------------------------------------------------- Fetal  Evaluation  Num Of Fetuses:         1  Fetal Heart Rate(bpm):  166  Cardiac Activity:       Observed  Presentation:           Cephalic  Placenta:               Anterior  P. Cord Insertion:      Visualized  Amniotic Fluid  AFI FV:      Within normal limits  AFI Sum(cm)     %Tile       Largest Pocket(cm)  14.35           50          4.76  RUQ(cm)       RLQ(cm)       LUQ(cm)        LLQ(cm)  3.18          4.68          1.73           4.76 ---------------------------------------------------------------------- Biophysical Evaluation  Amniotic F.V:   Pocket => 2 cm             F. Tone:        Observed  F. Movement:    Observed                   Score:          8/8  F. Breathing:   Observed ---------------------------------------------------------------------- Biometry  BPD:        76  mm     G. Age:  30w 3d        1.1  %    CI:        75.34   %    70 - 86                                                          FL/HC:      21.7   %    19.9 - 21.5  HC:      277.7  mm     G. Age:  30w 3d        < 1  %    HC/AC:      0.85        0.96 - 1.11  AC:      325.3  mm     G. Age:  36w 3d       > 99  %    FL/BPD:     79.2   %    71 - 87  FL:       60.2  mm     G. Age:  31w 2d          5   %    FL/AC:      18.5   %    20 - 24  HUM:      52.4  mm     G. Age:  30w 4d          6  %  Est. FW:    2286  gm      5 lb 1 oz     63  % ---------------------------------------------------------------------- OB History  Gravidity:    2  Term:   1  Living:       1 ---------------------------------------------------------------------- Gestational Age  U/S Today:     32w 1d                                        EDD:   11/06/20  Best:          33w 1d     Det. By:  Previous Ultrasound      EDD:   10/30/20                                      (03/20/20) ---------------------------------------------------------------------- Anatomy  Cranium:               Appears normal         Aortic Arch:            Previously seen  Cavum:                 Appears normal         Ductal Arch:            Previously seen  Ventricles:            Previously seen        Diaphragm:              Appears normal  Choroid Plexus:        Previously seen        Stomach:                Appears normal, left                                                                        sided  Cerebellum:            Previously seen        Abdomen:                Appears normal  Posterior Fossa:       Previously seen        Abdominal Wall:         Previously seen  Nuchal Fold:           Not applicable (>20    Cord Vessels:           Previously seen                         wks GA)  Face:                  Orbits and profile     Kidneys:                Appear normal                         previously seen  Lips:                  Previously seen        Bladder:  Appears normal  Thoracic:              Previously seen        Spine:                  Previously seen  Heart:                 Previously seen        Upper Extremities:      Previously seen  RVOT:                  Previously seen        Lower Extremities:      Previously seen  LVOT:                  Previously seen  Other:  Heels/feet and open hands/5th digits previously visualized.  ---------------------------------------------------------------------- Impression  Pregestational diabetes. Patient takes glyburide 5 mg twice  daily and reports her blood glucose levels are within normal  range.  Fetal growth is appropriate for gestational age .HC  measurement is at the -1 SD (normal). Amniotic fluid is  normal and good fetal activity is seen .Antenatal testing is  reassuring. BPP 8/8.  We reassured the patient of the findings.  After ultrasound, before leaving our office, the patient  reported leakage of amniotic fluid. We performed ultrasound  again. Good fetal heart activity is seen (159 bpm). AFI=19  cm. Cephalic presentation.  Patient was sent over to the MAU for evaluation.  MAU team was informed. ---------------------------------------------------------------------- Recommendations  Weekly BPP till delivery if PPROM is ruled out. ----------------------------------------------------------------------                  Noralee Space, MD Electronically Signed Final Report   09/12/2020 08:58 am ----------------------------------------------------------------------  [place] MFM OB FOLLOW UP  Result Date: 09/12/2020 ----------------------------------------------------------------------  OBSTETRICS REPORT                       (Signed Final 09/12/2020 08:58 am) ---------------------------------------------------------------------- Patient Info  ID #:       [id number]                          D.O.B.:  14-Jul-1991 (29 yrs)  Name:       Ranelle Oyster                   Visit Date: 09/12/2020 08:16 am ---------------------------------------------------------------------- Performed By  Attending:        Noralee Space MD        Ref. Address:     [facility] &                                                             Gynecology  [address]                                                             [address]                                                             [place], [place]                                                             [address]  Performed By:     Sandi Mealy        Location[facility]         [facility]                                     [facility] at                                                             [facility]  Referred By:      Jaymes Graff                    MD ---------------------------------------------------------------------- Orders  #  Description                           Code        Ordered By  1  [place] MFM FETAL BPP WO NON               60454.09    CORENTHIAN     STRESS  BOOKER  2  [place] MFM OB FOLLOW UP                   [id number]    Lin Landsman ----------------------------------------------------------------------  #  Order #                     Accession #                Episode #  1  [id number]                   [id number]                 [id number]  2  [id number]                   [id number]                 [id number] ---------------------------------------------------------------------- Indications  Pre-existing diabetes, type 2, in pregnancy,   O24.112  second trimester  Abnormal chromosomal and genetic finding       O28.5  on antenatal screening of mother (NIPS, low  FF, SMA Carrier)  Obesity complicating pregnancy, second         O99.212  trimester  Tobacco use complicating pregnancy,            O99.332  second trimester  Encounter for other antenatal screening        Z36.2  follow-up  [redacted] weeks gestation of pregnancy                Z3A.33 ---------------------------------------------------------------------- Vital Signs                                                 Height:        5'6"  ---------------------------------------------------------------------- Fetal Evaluation  Num Of Fetuses:         1  Fetal Heart Rate(bpm):  166  Cardiac Activity:       Observed  Presentation:           Cephalic  Placenta:               Anterior  P. Cord Insertion:      Visualized  Amniotic Fluid  AFI FV:      Within normal limits  AFI Sum(cm)     %Tile       Largest Pocket(cm)  14.35           50          4.76  RUQ(cm)       RLQ(cm)       LUQ(cm)        LLQ(cm)  3.18          4.68          1.73           4.76 ---------------------------------------------------------------------- Biophysical Evaluation  Amniotic F.V:   Pocket => 2 cm             F. Tone:  Observed  F. Movement:    Observed                   Score:          8/8  F. Breathing:   Observed ---------------------------------------------------------------------- Biometry  BPD:        76  mm     G. Age:  30w 3d        1.1  %    CI:        75.34   %    70 - 86                                                          FL/HC:      21.7   %    19.9 - 21.5  HC:      277.7  mm     G. Age:  30w 3d        < 1  %    HC/AC:      0.85        0.96 - 1.11  AC:      325.3  mm     G. Age:  36w 3d       > 99  %    FL/BPD:     79.2   %    71 - 87  FL:       60.2  mm     G. Age:  31w 2d          5  %    FL/AC:      18.5   %    20 - 24  HUM:      52.4  mm     G. Age:  30w 4d          6  %  Est. FW:    2286  gm      5 lb 1 oz     63  % ---------------------------------------------------------------------- OB History  Gravidity:    2         Term:   1  Living:       1 ---------------------------------------------------------------------- Gestational Age  U/S Today:     32w 1d                                        EDD:   11/06/20  Best:          33w 1d     Det. By:  Previous Ultrasound      EDD:   10/30/20                                      (03/20/20) ---------------------------------------------------------------------- Anatomy  Cranium:               Appears normal          Aortic Arch:            Previously seen  Cavum:                 Appears normal  Ductal Arch:            Previously seen  Ventricles:            Previously seen        Diaphragm:              Appears normal  Choroid Plexus:        Previously seen        Stomach:                Appears normal, left                                                                        sided  Cerebellum:            Previously seen        Abdomen:                Appears normal  Posterior Fossa:       Previously seen        Abdominal Wall:         Previously seen  Nuchal Fold:           Not applicable (>20    Cord Vessels:           Previously seen                         wks GA)  Face:                  Orbits and profile     Kidneys:                Appear normal                         previously seen  Lips:                  Previously seen        Bladder:                Appears normal  Thoracic:              Previously seen        Spine:                  Previously seen  Heart:                 Previously seen        Upper Extremities:      Previously seen  RVOT:                  Previously seen        Lower Extremities:      Previously seen  LVOT:                  Previously seen  Other:  Heels/feet and open hands/5th digits previously visualized. ---------------------------------------------------------------------- Impression  Pregestational diabetes. Patient takes glyburide 5 mg twice  daily and reports her blood glucose levels are within normal  range.  Fetal growth is appropriate for gestational age .HC  measurement is at the -1 SD (normal). Amniotic fluid is  normal and good fetal activity is seen .Antenatal testing is  reassuring. BPP 8/8.  We reassured the patient of the findings.  After ultrasound, before leaving our office, the patient  reported leakage of amniotic fluid. We performed ultrasound  again. Good fetal heart activity is seen (159 bpm). AFI=19  cm. Cephalic presentation.  Patient was sent over to the MAU  for evaluation.  MAU team was informed. ---------------------------------------------------------------------- Recommendations  Weekly BPP till delivery if PPROM is ruled out. ----------------------------------------------------------------------                  Noralee Space, MD Electronically Signed Final Report   09/12/2020 08:58 am ----------------------------------------------------------------------   MDM: Patient leaking moderate amount of clear fluid from introitus - speculum exam deferred. Fern test positive.  Cervical exam performed with sterile glove due to patient discomfort with regular contractions. 1/70/-3; unable to determine fetal presentation but ultrasound done this morning showed fetus was in cephalic position.   Called Dr. Normand Sloop to notify of patient's exam. Notified of NICU high census. Will have CNM come to MAU & admit patient.   Assessment: Leaking amniotic fluid [redacted] weeks gestation   Plan: [facility] CNM en route to admit patient  Judeth Horn, NP 09/12/2020 11:34 AM

## 2020-09-12 NOTE — Progress Notes (Signed)
Labor Progress Note  [name] [name] is a 29 y.o. female, G3P1011, IUP at 33.1 weeks, presenting for PPROM clear at 0840 on 10/20 after her [place] today. Currently having irregular cxt.  Denies vaginal bleeding.  Last [place] with [facility] today 10/20 EFW 5.1lbs, 63%, AFI prior to rupture was 14.35, cephalic, anterior placenta, BPP 8/8, reactive NST. DM2: On glyburide 5mg  BID.  Subjective: Pt was screaming in pain, becalm diaphoretic, and yelling "I feel pressure". I checked the pt very quickly and assessed the head was low, felt as if pt was complete, when I transferred the pt to LD I was able to better assess, she was 2.5cm dilated with low fetal head, pt still crying and not tolerating cxt, was given fentanyl which helped then given epidural, now comfortable post epidural. Dr was called and updated.  Patient Active Problem List   Diagnosis Date Noted  . Preterm premature rupture of membranes (PPROM) with unknown onset of labor 09/12/2020  . Preterm premature rupture of membranes (PPROM) with onset of labor within 24 hours of rupture in third trimester, antepartum 09/12/2020  . Pre-existing type 2 diabetes mellitus during pregnancy, antepartum 05/31/2020  . Bronchitis, acute, with bronchospasm 08/22/2012  . Fever 08/22/2012  . Tobacco abuse 08/22/2012   Objective: BP 125/71   Pulse 87   Temp 98.8 F (37.1 C) (Oral)   Resp 18   Ht 5\' 5"  (1.651 m)   Wt 108 kg   LMP 01/09/2020   SpO2 98%   BMI 39.61 kg/m  No intake/output data recorded. No intake/output data recorded. NST: FHR baseline 135 bpm, Variability: moderate, Accelerations:present, Decelerations:  Absent= Cat 1/Reactive CTX:  irregular, every 3-4 minutes, lasting 60 seconds Uterus gravid, soft non tender, moderate to palpate with contractions.  SVE:  Dilation: 2.5 Effacement (%): 90 Station: Plus 1 Exam by:: [facility], CNM  Vertex verified via sutures.   Assessment:  [name] [name] is a 29 y.o. female, G3P1011, IUP at 33.1  weeks, presenting for PPROM clear at 66 on 10/20 after her 0 today. Currently having irregular cxt.  Denies vaginal bleeding.  Last 11/20 with [facility] today 10/20 EFW 5.1lbs, 63%, AFI prior to rupture was 14.35, cephalic, anterior placenta, BPP 8/8, reactive NST. DM2: On glyburide 5mg  BID. Progressing in early preterm labor at 33.1 on magnesium for tocolysis until BMZ given will stop if pt delivers..  Patient Active Problem List   Diagnosis Date Noted  . Preterm premature rupture of membranes (PPROM) with unknown onset of labor 09/12/2020  . Preterm premature rupture of membranes (PPROM) with onset of labor within 24 hours of rupture in third trimester, antepartum 09/12/2020  . Pre-existing type 2 diabetes mellitus during pregnancy, antepartum 05/31/2020  . Bronchitis, acute, with bronchospasm 08/22/2012  . Fever 08/22/2012  . Tobacco abuse 08/22/2012   NICHD: Category 1  Membranes:  PPROM, clear at 0900 10/20 x 5hrs, no s/s of infection, BMZ given x1 dose at 1049 on 10/20, UC, GC/C, Utox pending.   Pain management:               IV pain management: x fentanyl x1 dose at 1214             Epidural placement:  at 1320 on 10/20  GBS Negative, but started on latency abx for prophylatics  Abx: 1gram azithromycin PO, IV vanc 380mg  IV @ 24H x2 doses, and clindamycin 900mg  IV Q8H x 6 doses    A1GDM: stable sugars  CBG (last 3)  No  results for input(s): GLUCAP in the last 72 hours.  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   Plan: Continue labor plan Continuous monitoring Rest Will reassess with cervical exam at 1600 or earlier if necessary Continue magnesium for tocolysis until BMZ given 2nd dose BMZ to be given 10/21 @ 1049 DM2: CBG Q4H, continue glyburide. May add metformin 500mg  BID PO PP. PP low carb diet.  Anticipate labor progression and vaginal delivery.   Md Dillard  aware of plan and  verbalized agreement.   11/21, NP-C, CNM, MSN 09/12/2020. 2:01 PM

## 2020-09-12 NOTE — Plan of Care (Signed)
  Problem: Education: Goal: Knowledge of condition will improve Outcome: Progressing   Problem: Activity: Goal: Will verbalize the importance of balancing activity with adequate rest periods Outcome: Progressing Goal: Ability to tolerate increased activity will improve Outcome: Progressing   Problem: Coping: Goal: Ability to identify and utilize available resources and services will improve Outcome: Progressing   Problem: Life Cycle: Goal: Chance of risk for complications during the postpartum period will decrease Outcome: Progressing   

## 2020-09-13 ENCOUNTER — Encounter (HOSPITAL_COMMUNITY): Payer: Self-pay | Admitting: Obstetrics and Gynecology

## 2020-09-13 LAB — URINE CULTURE: Culture: <10000 — AB

## 2020-09-13 LAB — CBC
HCT: 38.5 % (ref 36.0–46.0)
Hemoglobin: 12.9 g/dL (ref 12.0–15.0)
MCH: 29.6 pg (ref 26.0–34.0)
MCHC: 33.5 g/dL (ref 30.0–36.0)
MCV: 88.3 fL (ref 80.0–100.0)
Platelets: 244 10*3/uL (ref 150–400)
RBC: 4.36 MIL/uL (ref 3.87–5.11)
RDW: 11.9 % (ref 11.5–15.5)
WBC: 17.1 10*3/uL — ABNORMAL HIGH (ref 4.0–10.5)
nRBC: 0 % (ref 0.0–0.2)

## 2020-09-13 LAB — GLUCOSE, CAPILLARY
Glucose-Capillary: 230 mg/dL — ABNORMAL HIGH (ref 70–99)
Glucose-Capillary: 230 mg/dL — ABNORMAL HIGH (ref 70–99)
Glucose-Capillary: 188 mg/dL — ABNORMAL HIGH (ref 70–99)
Glucose-Capillary: 132 mg/dL — ABNORMAL HIGH (ref 70–99)

## 2020-09-13 LAB — GC/CHLAMYDIA PROBE AMP (~~LOC~~) NOT AT ARMC
Chlamydia: NEGATIVE
Comment: NEGATIVE
Comment: NORMAL
Neisseria Gonorrhea: NEGATIVE

## 2020-09-13 MED ORDER — INSULIN ASPART 100 UNIT/ML ~~LOC~~ SOLN: 0.0000 [IU] | Freq: Three times a day (TID) | SUBCUTANEOUS | Status: DC

## 2020-09-13 MED ORDER — INSULIN ASPART 100 UNIT/ML ~~LOC~~ SOLN
0.0000 [IU] | Freq: Three times a day (TID) | SUBCUTANEOUS | Status: DC
  Administered 2020-09-13: 4 [IU] via SUBCUTANEOUS
  Administered 2020-09-13: 7 [IU] via SUBCUTANEOUS
  Administered 2020-09-13: 3 [IU] via SUBCUTANEOUS

## 2020-09-13 NOTE — Lactation Note (Signed)
This note was copied from a baby's chart. Lactation Consultation Note  Patient Name: Girl Alyne Martinson [id number] Date: 09/13/2020 Reason for consult: Follow-up assessment;NICU baby;Preterm <34wks  1119 - 1135 - I followed up with Ms. Mannina. She states that she has pumped twice thus far. Baby "[name]" is in the NICU and is now 55 hours old. LC was unable to conduct pumping education yesterday. My lactation student and I reviewed the NICU booklet including pumping frequency, cleaning, and milk storage. I also provided a breast feeding resources brochure.  We assisted with flange fitting and review of pump settings. We determined a size 27 flange worked well at this time. I made her a pumping bra out of a belly band from L&D.  I provided reassurance that it's normal to see little to no milk express in the first days of pumping. I encouraged her to pump 8-12 times a day or every 2-3 hours during the day and every 3-4 hours at night.  Education on benefits of breast milk to baby in the NICU was provided. Cleaning supplies and colostrum containers present in her supply bin. I reviewed how to clean equipment.  I was unable to ask Ms. Nedrow regarding status of a personal pump. My consult ended quickly due to security entry in room to discuss visitation guidelines (number of allowed visitors). I wrote my name on the board and encouraged Ms. Walt to call lactation as needed.  Next LC visit should follow up on pump available for at-home use.   Interventions Interventions: Breast feeding basics reviewed;DEBP  Lactation Tools Discussed/Used Tools: Pump;Other (comment);Flanges (L&D "belly band") Flange Size: 27 Breast pump type: Double-Electric Breast Pump Pump Review: Setup, frequency, and cleaning;Milk Storage Initiated by:: hl Date initiated:: 09/13/20   Consult Status Consult Status: Follow-up Date: 09/14/20 Follow-up type: In-patient    Walker Shadow 09/13/2020, 11:44 AM

## 2020-09-13 NOTE — Anesthesia Postprocedure Evaluation (Signed)
Anesthesia Post Note  Patient: Architect  Procedure(s) Performed: AN AD HOC LABOR EPIDURAL     Patient location during evaluation: Mother Baby Anesthesia Type: Epidural Level of consciousness: awake and alert Pain management: pain level controlled Vital Signs Assessment: post-procedure vital signs reviewed and stable Respiratory status: spontaneous breathing, nonlabored ventilation and respiratory function stable Cardiovascular status: stable Postop Assessment: no headache, no backache and epidural receding Anesthetic complications: no   No complications documented.  Last Vitals:  Vitals:   09/13/20 0609 09/13/20 0800  BP: 117/73 117/68  Pulse: 76 65  Resp: 17 18  Temp: 36.8 C 36.8 C  SpO2: 98% 99%    Last Pain:  Vitals:   09/13/20 0800  TempSrc: Oral  PainSc:    Pain Goal: Patients Stated Pain Goal: 3 (09/13/20 0750)                 Emmaline Kluver N

## 2020-09-13 NOTE — Lactation Note (Signed)
This note was copied from a baby's chart. Lactation Consultation Note NICU Baby 8 hrs old. Attempted to see mom. Mom sleeping.  DEBP at bedside. RN stated mom has all ready pumped. Left Lactation brochure at bedside. Will attempt to see mom again later.  Patient Name: [name] [id number] Date: 09/13/2020     Maternal Data    Feeding    LATCH Score                   Interventions    Lactation Tools Discussed/Used     Consult Status      [name], Diamond Nickel 09/13/2020, 12:04 AM

## 2020-09-13 NOTE — Progress Notes (Signed)
Subjective: Postpartum Day # 1 : S/P NSVD due to PPROM at 0900 on 10/20, was on mag for tocolysis, BMZ x1 given, mag turned off pt was complete and feeling pressure, DM2 will continue on glyburide 5mg  Po BID and metformin 500mg  PO BID, fasting and 2HPP will be checked, GBS was negative, pt did receive IV clindamycin prior to delivery. Patient up ad lib, denies syncope or dizziness. Reports consuming regular diet without issues and denies N/V. Patient reports 0 bowel movement + passing flatus.  Denies issues with urination and reports bleeding is "lighter."  Patient is pump feeding and reports going well.  Desires undecided for postpartum contraception.  Pain is being appropriately managed with use of po meds. FBS this is am was 230 and pt endorses it was a true fasting, last ate dinner and last drank water. Utox resulted +MJ.   No laceration Feeding:  Pump Contraceptive plan:  Undecided Baby female  Objective: Vital signs in last 24 hours: Patient Vitals for the past 24 hrs:  BP Temp Temp src Pulse Resp SpO2 Height Weight  09/13/20 0609 117/73 98.3 F (36.8 C) Oral 76 17 98 % -- --  09/13/20 0041 119/64 98 F (36.7 C) Oral 75 16 98 % -- --  09/12/20 1954 121/68 99.2 F (37.3 C) Oral 86 18 100 % -- --  09/12/20 1815 110/74 -- -- 84 -- -- -- --  09/12/20 1715 (!) 112/59 -- -- 88 -- -- -- --  09/12/20 1702 110/63 -- -- 77 -- -- -- --  09/12/20 1647 106/65 -- -- 78 -- -- -- --  09/12/20 1632 113/72 -- -- 76 -- -- -- --  09/12/20 1616 118/75 -- -- 93 -- -- -- --  09/12/20 1602 118/75 -- -- 93 -- -- -- --  09/12/20 1547 116/60 -- -- 91 -- -- -- --  09/12/20 1532 119/60 99.3 F (37.4 C) Axillary 96 16 -- -- --  09/12/20 1518 106/68 -- -- (!) 108 -- -- -- --  09/12/20 1517 (!) 84/68 -- -- (!) 125 -- -- -- --  09/12/20 1501 127/78 -- -- 83 -- -- -- --  09/12/20 1458 132/79 -- -- 82 -- -- -- --  09/12/20 1401 123/87 -- -- 93 -- -- -- --  09/12/20 1333 118/69 -- -- 78 -- -- -- --  09/12/20  1331 -- -- -- -- -- 99 % -- --  09/12/20 1326 -- -- -- -- -- 99 % -- --  09/12/20 1322 125/71 -- -- 87 18 -- -- --  09/12/20 1320 -- -- -- -- -- 98 % -- --  09/12/20 1319 114/74 -- -- 83 -- -- -- --  09/12/20 1318 (!) 87/40 -- -- (!) 104 18 -- -- --  09/12/20 1316 -- -- -- -- -- 99 % -- --  09/12/20 1312 132/65 -- -- 70 18 -- -- --  09/12/20 1311 -- -- -- -- -- 99 % -- --  09/12/20 1307 132/60 -- -- 70 18 -- -- --  09/12/20 1306 -- -- -- -- -- 99 % -- --  09/12/20 1302 (!) 136/58 -- -- 74 18 -- -- --  09/12/20 1301 -- -- -- -- -- 99 % -- --  09/12/20 1257 132/70 -- -- 82 18 -- -- --  09/12/20 1254 130/76 -- -- 89 18 99 % -- --  09/12/20 1252 130/76 -- -- 89 18 -- -- --  09/12/20 1251 -- -- -- -- -- 98 % -- --  09/12/20 1250 135/72 -- -- 77 18 -- -- --  09/12/20 1246 -- -- -- -- -- 98 % -- --  09/12/20 1241 -- -- -- -- -- 97 % -- --  09/12/20 1236 -- -- -- -- -- 95 % -- --  09/12/20 1231 -- -- -- -- -- 98 % -- --  09/12/20 1228 -- 98.8 F (37.1 C) Oral -- 18 -- -- --  09/12/20 1226 -- -- -- -- -- 95 % -- --  09/12/20 1222 (!) 140/97 -- -- 77 -- -- -- --  09/12/20 1221 -- -- -- -- -- 99 % -- --  09/12/20 0943 110/68 98.4 F (36.9 C) -- 86 16 97 % 5\' 5"  (1.651 m) 108 kg     Physical Exam:  General: alert, cooperative, appears stated age and no distress Mood/Affect: Happy Lungs: clear to auscultation, no wheezes, rales or rhonchi, symmetric air entry.  Heart: normal rate, regular rhythm, normal S1, S2, no murmurs, rubs, clicks or gallops. Breast: breasts appear normal, no suspicious masses, no skin or nipple changes or axillary nodes. Abdomen:  + bowel sounds, soft, non-tender GU: perineum intact, healing well. No signs of external hematomas.  Uterine Fundus: firm Lochia: appropriate Skin: Warm, Dry. DVT Evaluation: No evidence of DVT seen on physical exam. Negative Homan's sign. No cords or calf tenderness. No significant calf/ankle edema.  CBC Latest Ref Rng & Units  09/12/2020 07/03/2020 03/20/2020  WBC 4.0 - 10.5 K/uL 8.1 14.2(H) 9.0  Hemoglobin 12.0 - 15.0 g/dL 03/22/2020 81.0 15.2(H)  Hematocrit 36 - 46 % 42.9 40.9 45.5  Platelets 150 - 400 K/uL 256 251 306    Results for orders placed or performed during the hospital encounter of 09/12/20 (from the past 24 hour(s))  Urinalysis, Routine w reflex microscopic Urine, Clean Catch     Status: Abnormal   Collection Time: 09/12/20  9:34 AM  Result Value Ref Range   Color, Urine YELLOW YELLOW   APPearance HAZY (A) CLEAR   Specific Gravity, Urine 1.021 1.005 - 1.030   pH 6.0 5.0 - 8.0   Glucose, UA >=500 (A) NEGATIVE mg/dL   Hgb urine dipstick MODERATE (A) NEGATIVE   Bilirubin Urine NEGATIVE NEGATIVE   Ketones, ur NEGATIVE NEGATIVE mg/dL   Protein, ur 09/14/20 (A) NEGATIVE mg/dL   Nitrite NEGATIVE NEGATIVE   Leukocytes,Ua NEGATIVE NEGATIVE   RBC / HPF 11-20 0 - 5 RBC/hpf   WBC, UA 6-10 0 - 5 WBC/hpf   Bacteria, UA RARE (A) NONE SEEN   Squamous Epithelial / LPF 11-20 0 - 5   Mucus PRESENT   Fern Test     Status: Normal   Collection Time: 09/12/20  9:58 AM  Result Value Ref Range   POCT Fern Test Positive = ruptured amniotic membanes   CBC     Status: None   Collection Time: 09/12/20 10:25 AM  Result Value Ref Range   WBC 8.1 4.0 - 10.5 K/uL   RBC 4.83 3.87 - 5.11 MIL/uL   Hemoglobin 14.4 12.0 - 15.0 g/dL   HCT 09/14/20 36 - 46 %   MCV 88.8 80.0 - 100.0 fL   MCH 29.8 26.0 - 34.0 pg   MCHC 33.6 30.0 - 36.0 g/dL   RDW 58.5 27.7 - 82.4 %   Platelets 256 150 - 400 K/uL   nRBC 0.0 0.0 - 0.2 %  Respiratory Panel by RT PCR (Flu A&B, Covid) - Nasopharyngeal Swab     Status: None  Collection Time: 09/12/20 10:35 AM   Specimen: Nasopharyngeal Swab  Result Value Ref Range   SARS Coronavirus 2 by RT PCR NEGATIVE NEGATIVE   Influenza A by PCR NEGATIVE NEGATIVE   Influenza B by PCR NEGATIVE NEGATIVE  Type and screen [facility]     Status: None   Collection Time: 09/12/20 10:43 AM  Result Value  Ref Range   ABO/RH(D) AB POS    Antibody Screen NEG    Sample Expiration      09/15/2020,2359 Performed at [facility], [address], [place], [place] [address]   Group B strep by PCR     Status: None   Collection Time: 09/12/20 11:34 AM   Specimen: Vaginal/Rectal; Genital  Result Value Ref Range   Group B strep by PCR NEGATIVE NEGATIVE  Urinalysis, Complete w Microscopic PATH Cytology Urine     Status: Abnormal   Collection Time: 09/12/20  3:22 PM  Result Value Ref Range   Color, Urine YELLOW YELLOW   APPearance CLEAR CLEAR   Specific Gravity, Urine 1.024 1.005 - 1.030   pH 5.0 5.0 - 8.0   Glucose, UA >=500 (A) NEGATIVE mg/dL   Hgb urine dipstick NEGATIVE NEGATIVE   Bilirubin Urine NEGATIVE NEGATIVE   Ketones, ur 80 (A) NEGATIVE mg/dL   Protein, ur NEGATIVE NEGATIVE mg/dL   Nitrite NEGATIVE NEGATIVE   Leukocytes,Ua NEGATIVE NEGATIVE   RBC / HPF 0-5 0 - 5 RBC/hpf   WBC, UA 0-5 0 - 5 WBC/hpf   Bacteria, UA RARE (A) NONE SEEN   Squamous Epithelial / LPF 0-5 0 - 5   Mucus PRESENT   Rapid urine drug screen (hospital performed)     Status: Abnormal   Collection Time: 09/12/20  3:22 PM  Result Value Ref Range   Opiates NONE DETECTED NONE DETECTED   Cocaine NONE DETECTED NONE DETECTED   Benzodiazepines NONE DETECTED NONE DETECTED   Amphetamines NONE DETECTED NONE DETECTED   Tetrahydrocannabinol POSITIVE (A) NONE DETECTED   Barbiturates NONE DETECTED NONE DETECTED  Glucose, capillary     Status: Abnormal   Collection Time: 09/13/20  6:06 AM  Result Value Ref Range   Glucose-Capillary 230 (H) 70 - 99 mg/dL     CBG (last 3)  Recent Labs    09/13/20 0606  GLUCAP 230*     I/O last 3 completed shifts: In: -  Out: 832 [Urine:800; Blood:32]   Assessment Postpartum Day # 1 : S/P NSVD due to PPROM at 0900 on 10/20, was on mag for tocolysis, BMZ x1 given, mag turned off pt was complete and feeling pressure, DM2 will continue on glyburide 5mg  Po BID and metformin 500mg   PO BID, fasting and 2HPP will be checked, GBS was negative, pt did receive IV clindamycin prior to delivery. Pt stable. -1 involution. Pump feeding. Hemodynamically stable. +MJ on UTOX.   Plan: Continue other mgmt as ordered DM2: Ordered sliding scale for elevated cbg with pp cbg. Continue glyburide 5mg  BID PO, and started metformin 500mg  BID PO. Monitor Fasting and 2HPP. GC/C/UC Still pending VTE prophylactics: Early ambulated as tolerates.  Pain control: Motrin/Tylenol PRN Education given regarding options for contraception, including barrier methods, injectable contraception, IUD placement, oral contraceptives.  Plan for discharge tomorrow, Breastfeeding, Lactation consult and Contraception undecided   Dr. to be updated on patient status  [facility] NP-C, CNM 09/13/2020, 6:44 AM

## 2020-09-13 NOTE — Clinical Social Work Maternal (Signed)
CLINICAL SOCIAL WORK MATERNAL/CHILD NOTE  Patient Details  Name: [name] [name] MRN: [id number] Date of Birth: 07/07/1991  Date:  08/22/20  Clinical Social Worker Initiating Note:  Laurey Arrow Date/Time: Initiated:  09/13/20/1359     Child's Name:  Shela Commons   Biological Parents:  Mother, Father   Need for Interpreter:  None   Reason for Referral:  Current Substance Use/Substance Use During Pregnancy    Address:  [address] [place] [address]    Phone number:  [phone] (home)    Additional phone number: FOB's number is [phone]  Household Members/Support Persons (HM/SP):   Household Member/Support Person 1, Household Member/Support Person 2   HM/SP Name Relationship DOB or Age  HM/SP -1 Nikko Quast FOB 08/04/1997  HM/SP -2 Ava Kissling daughter 08/01/2009  HM/SP -3        HM/SP -4        HM/SP -5        HM/SP -6        HM/SP -7        HM/SP -8          Natural Supports (not living in the home):  Extended Family, Immediate Family, Parent   Professional Supports: None   Employment: Unemployed   Type of Work:     Education:  Programmer, systems   Homebound arranged:    Museum/gallery curator Resources:  Medicaid   Other Resources:  Physicist, medical , [place] Considerations Which May Impact Care:  None reported  Strengths:  Home prepared for child , Ability to meet basic needs , Pediatrician chosen   Psychotropic Medications:         Pediatrician:    [facility] area  Pediatrician List:   [facility]      Pediatrician Fax Number:    Risk Factors/Current Problems:  Substance Use    Cognitive State:  Able to Concentrate , Alert , Linear Thinking , Insightful    Mood/Affect:  Calm , Happy , Relaxed , Comfortable    CSW Assessment: CSW met with MOB on room 114 to complete an assessment for a  consult for hx of THC use in pregnancy. When CSW arrived, MOB was sitting in bed, FOB was resting in the recliner, and MOB's sister was resting on the couch. CSW explained CSW's role and with MOB's permission, CSW asked MOB's guest to leave in order to assess MOB in private and adhere to HIPAA policy.  MOB was polite and was receptive with meeting with CSW.    CSW inquired about MOB's substance use, and hx and initially MOB denied using any substances during her pregnancy. CSW made MOB aware that MOB had a positive UDS for [facility] on admission.  MOB communicated, "I did mistakenly take a edible on Monday (10/18)." CSW informed MOB of the hospital's drug screen policy. MOB was made aware of the 2 drug screenings for the infant.  MOB was understanding and did not have any concerns or questions.  CSW shared with MOB that the infant's UDS is negative and CSW will  continue to monitor the infant's CDS.  CSW shared that infant's CDS is positive, CSW will make a report to [facility] CPS. CSW offered MOB resources and referrals for substance interventions and MOB declined. MOB also denied having any CPS hx. MOB did not have any questions about  the hospital's policy and reported having all essential items for infant. MOB also denied barriers to visiting with infant after she discharges.   PMAD education was provided as well as SIDS education.   CSW will continue to offer resources and supports to family while infant remains in NICU.    CSW Plan/Description:  Psychosocial Support and Ongoing Assessment of Needs, Sudden Infant Death Syndrome (SIDS) Education, Perinatal Mood and Anxiety Disorder (PMADs) Education, Other Patient/Family Education, [place] Information, Other Information/Referral to [employer], CSW Will Continue to Monitor Umbilical Cord Tissue Drug Screen Results and Make Report if Warranted   Laurey Arrow, MSW, LCSW Clinical Social Work [phone]  Dimple Nanas, LCSW 09/13/2020, 2:02 PM

## 2020-09-14 LAB — GLUCOSE, CAPILLARY
Glucose-Capillary: 130 mg/dL — ABNORMAL HIGH (ref 70–99)
Glucose-Capillary: 117 mg/dL — ABNORMAL HIGH (ref 70–99)

## 2020-09-14 LAB — HEMOGLOBIN A1C
Hgb A1c MFr Bld: 8.9 % — ABNORMAL HIGH (ref 4.8–5.6)
Mean Plasma Glucose: 209 mg/dL

## 2020-09-14 LAB — SURGICAL PATHOLOGY

## 2020-09-14 MED ORDER — INSULIN ASPART 100 UNIT/ML ~~LOC~~ SOLN
3.0000 [IU] | Freq: Once | SUBCUTANEOUS | Status: AC
  Administered 2020-09-14: 3 [IU] via SUBCUTANEOUS

## 2020-09-14 MED ORDER — GLYBURIDE 5 MG PO TABS
5.0000 mg | ORAL_TABLET | Freq: Two times a day (BID) | ORAL | 1 refills | Status: DC
Start: 2020-09-14 — End: 2021-03-13

## 2020-09-14 MED ORDER — ACETAMINOPHEN 325 MG PO TABS
650.0000 mg | ORAL_TABLET | ORAL | 1 refills | Status: DC | PRN
Start: 2020-09-14 — End: 2021-10-23

## 2020-09-14 MED ORDER — BENZOCAINE-MENTHOL 20-0.5 % EX AERO: 1.0000 "application " | INHALATION_SPRAY | CUTANEOUS | 1 refills | Status: DC | PRN

## 2020-09-14 MED ORDER — IBUPROFEN 600 MG PO TABS
600.0000 mg | ORAL_TABLET | Freq: Four times a day (QID) | ORAL | 0 refills | Status: DC
Start: 2020-09-14 — End: 2021-10-23

## 2020-09-14 MED ORDER — METFORMIN HCL 500 MG PO TABS
500.0000 mg | ORAL_TABLET | Freq: Two times a day (BID) | ORAL | 1 refills | Status: DC
Start: 2020-09-14 — End: 2021-03-13

## 2020-09-14 NOTE — Discharge Summary (Signed)
OB Discharge Summary  Patient Name: [name] [name] DOB: Jan 12, 1991 MRN: [id number]  Date of admission: 09/12/2020 Delivering provider: Dale Franklin   Admitting diagnosis: Preterm premature rupture of membranes (PPROM) with unknown onset of labor [O42.919] Preterm premature rupture of membranes (PPROM) with onset of labor within 24 hours of rupture in third trimester, antepartum [O42.013] Intrauterine pregnancy: [redacted]w[redacted]d     Secondary diagnosis: Patient Active Problem List   Diagnosis Date Noted  . Postpartum care following vaginal delivery 10/20 09/14/2020  . Preterm premature rupture of membranes (PPROM) with onset of labor within 24 hours of rupture in third trimester, antepartum 09/12/2020  . Preterm labor in third trimester with term delivery 09/12/2020  . Normal postpartum course 09/12/2020  . Pre-existing type 2 diabetes mellitus during pregnancy, antepartum 05/31/2020   Date of discharge: 09/14/2020   Discharge diagnosis: Principal Problem:   Postpartum care following vaginal delivery 10/20 Active Problems:   Pre-existing type 2 diabetes mellitus during pregnancy, antepartum   Preterm premature rupture of membranes (PPROM) with onset of labor within 24 hours of rupture in third trimester, antepartum   Preterm labor in third trimester with term delivery   Normal postpartum course                                                          Post partum procedures:None  Augmentation: N/A Pain control: Epidural  Laceration:None  Episiotomy:None  Complications: None  Hospital course:  Onset of Labor With Vaginal Delivery      29 y.o. yo [id number] at [redacted]w[redacted]d was admitted in Active Labor on 09/12/2020. Patient had an uncomplicated labor course as follows:  Membrane Rupture Time/Date: 8:40 AM ,09/12/2020   Delivery Method:Vaginal, Spontaneous  Episiotomy: None  Lacerations:  None  Patient had a postpartum course complicated by Type 2 diabetes. She had elevated blood sugars after  delivery that required sliding scale insulin and the addition of Metformin 500mg  daily. She will be discharged home on glyburide 5mg  BID and metformin 500mg  BID. She will follow-up in the office in 2 weeks. She is ambulating, tolerating a regular diet, passing flatus, and urinating well. Patient is discharged home in stable condition on 09/14/20.  Newborn Data: Birth date:09/12/2020  Birth time:3:14 PM  Gender:Female  Living status:Living  Apgars:5 ,7  Weight:2390 g   Physical exam  Vitals:   09/14/20 0607 09/14/20 0843 09/14/20 0845 09/14/20 1102  BP: 108/64 107/71  109/62  Pulse: 72 74  72  Resp:  18  18  Temp: 98.1 F (36.7 C) 97.9 F (36.6 C)  98.2 F (36.8 C)  TempSrc:  Oral  Oral  SpO2: 98%  99% 98%  Weight:      Height:       General: alert, cooperative and no distress Lochia: appropriate Uterine Fundus: firm Perineum: repair intact, no edema DVT Evaluation: No evidence of DVT seen on physical exam. No significant calf/ankle edema. Labs: Lab Results  Component Value Date   WBC 17.1 (H) 09/13/2020   HGB 12.9 09/13/2020   HCT 38.5 09/13/2020   MCV 88.3 09/13/2020   PLT 244 09/13/2020   CMP Latest Ref Rng & Units 07/03/2020  Glucose 70 - 99 mg/dL 09/15/2020)  BUN 6 - 20 mg/dL 13  Creatinine 09/15/2020 - 09/02/2020 mg/dL 517(G  Sodium 0.17 - 4.94 mmol/L 136  Potassium 3.5 - 5.1 mmol/L 3.8  Chloride 98 - 111 mmol/L 102  CO2 22 - 32 mmol/L 23  Calcium 8.9 - 10.3 mg/dL 9.7  Total Protein 6.5 - 8.1 g/dL 7.5  Total Bilirubin 0.3 - 1.2 mg/dL 1.1  Alkaline Phos 38 - 126 U/L 53  AST 15 - 41 U/L 14(L)  ALT 0 - 44 U/L 22   Edinburgh Postnatal Depression Scale Screening Tool 09/13/2020  I have been able to laugh and see the funny side of things. 0  I have looked forward with enjoyment to things. 0  I have blamed myself unnecessarily when things went wrong. 0  I have been anxious or worried for no good reason. 0  I have felt scared or panicky for no good reason. 0  Things have been  getting on top of me. 0  I have been so unhappy that I have had difficulty sleeping. 0  I have felt sad or miserable. 0  I have been so unhappy that I have been crying. 0  The thought of harming myself has occurred to me. 0  Edinburgh Postnatal Depression Scale Total 0   Vaccines: TDaP Declined         Flu    Declined  Discharge instructions:  per After Visit Summary  After Visit Meds:  Allergies as of 09/14/2020      Reactions   Penicillins Rash      Medication List    STOP taking these medications   albuterol 108 (90 Base) MCG/ACT inhaler Commonly known as: VENTOLIN HFA   clindamycin 150 MG capsule Commonly known as: CLEOCIN     TAKE these medications   acetaminophen 325 MG tablet Commonly known as: Tylenol Take 2 tablets (650 mg total) by mouth every 4 (four) hours as needed (for pain scale < 4). What changed:   medication strength  how much to take  when to take this  reasons to take this   benzocaine-Menthol 20-0.5 % Aero Commonly known as: DERMOPLAST Apply 1 application topically as needed for irritation (perineal discomfort).   glyBURIDE 5 MG tablet Commonly known as: DIABETA Take 1 tablet (5 mg total) by mouth 2 (two) times daily with a meal. What changed: when to take this   ibuprofen 600 MG tablet Commonly known as: ADVIL Take 1 tablet (600 mg total) by mouth every 6 (six) hours.   metFORMIN 500 MG tablet Commonly known as: GLUCOPHAGE Take 1 tablet (500 mg total) by mouth 2 (two) times daily with a meal. What changed: how much to take   multivitamin-prenatal 27-0.8 MG Tabs tablet Take 1 tablet by mouth daily at 12 noon.            Discharge Care Instructions  (From admission, onward)         Start     Ordered   09/14/20 0000  Discharge wound care:       Comments: Sitz baths 2 times /day with warm water x 1 week. May add herbals: 1 ounce dried comfrey leaf* 1 ounce calendula flowers 1 ounce lavender flowers  Supplies can be  found online at [employer] sources at [employer], [employer]  1/2 ounce dried uva ursi leaves 1/2 ounce witch hazel blossoms (if you can find them) 1/2 ounce dried sage leaf 1/2 cup sea salt Directions: Bring 2 quarts of water to a boil. Turn off heat, and place 1 ounce (approximately 1 large handful) of the above mixed herbs (not the  salt) into the pot. Steep, covered, for 30 minutes.  Strain the liquid well with a fine mesh strainer, and discard the herb material. Add 2 quarts of liquid to the tub, along with the 1/2 cup of salt. This medicinal liquid can also be made into compresses and peri-rinses.   09/14/20 1152         Diet: carb modified diet  Activity: Advance as tolerated. Pelvic rest for 6 weeks.   Newborn Data: Live born female  Birth Weight: 5 lb 4.3 oz (2390 g) APGAR: 5, 7  Newborn Delivery   Birth date/time: 09/12/2020 15:14:00 Delivery type: Vaginal, Spontaneous     Named [name] Baby Feeding: pumping breastmilk for baby in NICU Disposition:NICU  Delivery Report:  Review the Delivery Report for details.    Follow up:  Follow-up Information    [facility]. Schedule an appointment as soon as possible for a visit in 2 week(s).   Specialty: Obstetrics and Gynecology Why: Please make an appointment for 2 weeks postpartum.  Contact information: [address] [place] [address] [phone]             Clancy Gourd, MSN 09/14/2020, 12:18 PM

## 2020-09-14 NOTE — Progress Notes (Signed)
Pt discharged to home with significant other and sister.  Condition stable.  Pt plans to obtain DEBP from [facility] office.  No equipment for home ordered at discharge.

## 2020-09-19 ENCOUNTER — Ambulatory Visit: Payer: Medicaid Other

## 2020-09-19 ENCOUNTER — Ambulatory Visit: Payer: Self-pay

## 2020-09-19 NOTE — Lactation Note (Signed)
This note was copied from a baby's chart. Lactation Consultation Note  Patient Name: [name] [name] [id number] Date: 09/19/2020 Reason for consult: Follow-up assessment;1st time breastfeeding;NICU baby;Late-preterm 34-36.6wks LC to room for 1st bf attempt. Mother is pumping with DEBP while at hospital and hand pump at home. Baby was crying when LC entered and did not calm when placed in biological bf position. Attempted to assist briefly followed by placing baby in sts position. Baby calmed quickly and fell asleep. Reviewed feeding cues with mother and encouraged her to observe for cues. Offered to return to assist prn according to IDF protocol. LC offered mother the opportunity to ask questions and addressed all concerns. [facility] sent [facility] referral for loaner pump. Will plan f/u visit. Prn.   Maternal Data Does the patient have breastfeeding experience prior to this delivery?: No  Feeding Feeding Type: Breast Milk  LATCH Score Latch: Too sleepy or reluctant, no latch achieved, no sucking elicited.  Audible Swallowing: None  Type of Nipple: Everted at rest and after stimulation  Comfort (Breast/Nipple): Soft / non-tender  Hold (Positioning): Full assist, staff holds infant at breast  LATCH Score: 4  Interventions Interventions: Breast feeding basics reviewed;Assisted with latch;Skin to skin;Adjust position;Support pillows;Position options;Expressed milk;DEBP  Lactation Tools Discussed/Used     Consult Status Consult Status: Follow-up Date: 09/20/20 Follow-up type: In-patient    [name] 09/19/2020, 4:44 PM

## 2020-09-21 ENCOUNTER — Ambulatory Visit: Payer: Self-pay

## 2020-09-21 NOTE — Lactation Note (Signed)
This note was copied from a baby's chart. Lactation Consultation Note  Patient Name: [name] [id number] Date: 09/21/2020 Reason for consult: Follow-up assessment;Mother's request;NICU baby  LC paged to room to assist with positioning/latch. Mom is pumping q 2-3 hours and yielding 3-4 oz per pumping session. Baby previously cuing but asleep when LC arrived. LC assisted with unwrapping baby and placing in biological position. Baby did not wake. Left sts and reviewed normal preterm infant behaviors. Encouraged mom to ask for further help prn.  Feeding Feeding Type: Breast Milk Nipple Type: Nfant Extra Slow Flow (gold)  LATCH Score Latch: Too sleepy or reluctant, no latch achieved, no sucking elicited.  Audible Swallowing: None  Type of Nipple: Everted at rest and after stimulation  Comfort (Breast/Nipple): Soft / non-tender  Hold (Positioning): Assistance needed to correctly position infant at breast and maintain latch.  LATCH Score: 5  Interventions Interventions: Breast feeding basics reviewed;Support pillows;Position options;Assisted with latch;Skin to skin;Expressed milk;Adjust position;DEBP  Lactation Tools Discussed/Used     Consult Status Consult Status: Follow-up Date: 09/22/20 Follow-up type: In-patient    [name] 09/21/2020, 2:51 PM

## 2020-09-25 ENCOUNTER — Ambulatory Visit: Payer: Self-pay

## 2020-09-25 NOTE — Lactation Note (Signed)
This note was copied from a baby's chart. Lactation Consultation Note  Patient Name: [name] [name] [id number] Date: 09/25/2020 Reason for consult: Follow-up assessment;NICU baby   LC to room for f/u visit. Mother continues to pump and yields enough milk to meet infant's needs. Infant is bottle feeding but mother has not re-challenged at the breast. She would like assistance tomorrow at 1400. LC will plan f/u at that time. Relayed to RN.  Interventions Interventions: Breast feeding basics reviewed;Skin to skin;DEBP   Consult Status Consult Status: Follow-up Date: 09/26/20 Follow-up type: In-patient    [name] [name] 09/25/2020, 4:58 PM

## 2020-09-26 ENCOUNTER — Ambulatory Visit: Payer: Medicaid Other

## 2020-09-26 ENCOUNTER — Ambulatory Visit: Payer: Self-pay

## 2020-09-26 NOTE — Lactation Note (Signed)
This note was copied from a baby's chart. Lactation Consultation Note  Patient Name: [name] [name] [id number] Date: 09/26/2020 Reason for consult: Follow-up assessment;NICU baby  LC to room for 1400 feeding attempt. Reviewed readiness cues. Placed infant in biological position with mom and lightly pestered. Baby did not wake during consult. Left baby sts and reported to RN. Reviewed signs that baby is ready to eat and offered to return when baby begins to cue.   LATCH Score Latch: Too sleepy or reluctant, no latch achieved, no sucking elicited.  Audible Swallowing: None  Type of Nipple: Everted at rest and after stimulation  Comfort (Breast/Nipple): Soft / non-tender  Hold (Positioning): Full assist, staff holds infant at breast  LATCH Score: 4  Interventions Interventions: Breast feeding basics reviewed;Support pillows;Assisted with latch;Position options;Skin to skin;Expressed milk;Hand express;Breast compression;Adjust position;DEBP  Lactation Tools Discussed/Used     Consult Status Consult Status: Follow-up Date: 09/27/20 Follow-up type: In-patient    [name] 09/26/2020, 2:11 PM

## 2020-10-03 ENCOUNTER — Ambulatory Visit: Payer: Medicaid Other

## 2020-10-03 ENCOUNTER — Ambulatory Visit: Payer: Self-pay

## 2020-10-03 NOTE — Lactation Note (Signed)
This note was copied from a baby's chart. Lactation Consultation Note  Patient Name: [name] [name] [id number] Date: 10/03/2020   0919 - 0936 - I followed up with Ms. Pund and her support person. She states that baby [name] has been showing increased awareness prior to feedings and increased rooting behaviors. She states that baby has been receiving some of her milk via bottle, and baby's ability to bottle feed has been improving.  Ms. Duling would like to breast feed. When she is discharged, she'd like to do a combination of breast feeding and bottle feeding her EBM.  Ms. Scantlin asked for a lactation appointment tomorrow for the 1400 feeding. I scheduled an appointment for latch assistance. She has not been putting baby to the breast as often as she has been using a bottle.   She noted that baby had some thrush on her tongue.  Ms. Spira has been pumping every three hours consistently. She states that she obtains 3-4 ounces/pump with slightly higher volume in the mornings. She had no concerns about how to use her pump. She states that she is not returning to work in the immediate future.  All questions answered at this time.    Feeding Feeding Type: Breast Milk Nipple Type: Dr. Levert Feinstein Preemie    Walker Shadow 10/03/2020, 9:42 AM

## 2020-10-04 ENCOUNTER — Ambulatory Visit: Payer: Self-pay

## 2020-10-04 NOTE — Lactation Note (Signed)
This note was copied from a baby's chart. Lactation Consultation Note  Patient Name: Girl Delphia Kaylor [id number] Date: 10/04/2020 Reason for consult: Follow-up assessment;Mother's request;NICU baby  LC to room to assist with 1400 feeding. Baby did not wake with pestering or HE/enticement at the breast. Left sts. Mom states that baby is more active in the evening and requests to attempt again at 2000 feeding. Will plan f/u visit.   Consult Status Consult Status: Follow-up Date: 10/05/20 Follow-up type: In-patient    [name] 10/04/2020, 2:16 PM

## 2020-10-05 ENCOUNTER — Ambulatory Visit: Payer: Self-pay

## 2020-10-05 NOTE — Lactation Note (Signed)
This note was copied from a baby's chart. Lactation Consultation Note  Patient Name: [name] [name] [id number] Date: 10/05/2020 Reason for consult: Follow-up assessment;NICU baby  LC to room for follow up visit.  Assisted with feeding in cross cradle hold. Mom pre-pumped. Baby on with intermittent swallowing; used 20 mm shield. Milk visible in shield p feeding. Mother pleased. Will plan return visit tomorrow at 1100. Infant to begin IDF per RN.   Feeding Feeding Type: Breast Fed Nipple Type: Dr. Levert Feinstein Preemie  LATCH Score Latch: Grasps breast easily, tongue down, lips flanged, rhythmical sucking.  Audible Swallowing: A few with stimulation  Type of Nipple: Everted at rest and after stimulation  Comfort (Breast/Nipple): Soft / non-tender  Hold (Positioning): Assistance needed to correctly position infant at breast and maintain latch.  LATCH Score: 8  Interventions Interventions: Breast feeding basics reviewed;Support pillows;Assisted with latch;Position options;Skin to skin;Expressed milk;Breast massage;Breast compression;DEBP;Adjust position  Lactation Tools Discussed/Used     Consult Status Consult Status: Follow-up Date: 10/06/20 Follow-up type: In-patient    [name] [name] 10/05/2020, 8:33 PM

## 2020-10-06 ENCOUNTER — Ambulatory Visit: Payer: Self-pay

## 2020-10-06 NOTE — Lactation Note (Signed)
This note was copied from a baby's chart. Lactation Consultation Note  Patient Name: [name] [name] [id number] Date: 10/06/2020 Reason for consult: Follow-up assessment;Mother's request;NICU baby  LC to room for follow up visit.  Mother and baby practiced bf during the night and again this morning. LC observed mom position baby to feed. Baby awake but not cuing. Occasional assistance prn from [facility]. About 1 minute of effective bf with swallows, in addition to non-nutritive sucks and sleeping at breast. Reviewed feeding norms at 36 weeks and encouraged continued practice with bf. Mom continues to pre-pump before bf. Will plan follow up visit.   Feeding Feeding Type: Breast Fed Nipple Type: Nfant Extra Slow Flow (gold)  LATCH Score Latch: Too sleepy or reluctant, no latch achieved, no sucking elicited.  Audible Swallowing: A few with stimulation  Type of Nipple: Everted at rest and after stimulation  Comfort (Breast/Nipple): Soft / non-tender  Hold (Positioning): Assistance needed to correctly position infant at breast and maintain latch.  LATCH Score: 6  Interventions Interventions: Breast feeding basics reviewed;Support pillows;Assisted with latch;Position options;Skin to skin;Expressed milk;Adjust position  Lactation Tools Discussed/Used     Consult Status Consult Status: Follow-up Date: 10/07/20 Follow-up type: In-patient    [name] 10/06/2020, 11:17 AM

## 2020-10-07 ENCOUNTER — Ambulatory Visit: Payer: Self-pay

## 2020-10-07 NOTE — Lactation Note (Signed)
This note was copied from a baby's chart. Lactation Consultation Note  Patient Name: [name] [name] [id number] Date: 10/07/2020 Reason for consult: Follow-up assessment;NICU baby  Mom attempting to put infant to breast during gavage feeding but infant was too sleepy to latch.  LC entered and mom was holding sleeping infant STS.  Mom requested a 20 NS from RN so LC provided one for her.  Mom states she is pumping every 2-3 hours and the pumping is going very well.  She has no concerns at this point.  She appeared relaxed and comfortable.  LC praised her for her commitment to pumping and providing EBM to her infant.   Maternal Data    Feeding Feeding Type: Breast Fed Nipple Type: Dr. Levert Feinstein Preemie  LATCH Score                   Interventions Interventions: Breast feeding basics reviewed  Lactation Tools Discussed/Used     Consult Status Consult Status: Follow-up Date: 10/08/20 Follow-up type: In-patient    Maryruth Hancock [facility] 10/07/2020, 11:16 AM

## 2020-10-10 ENCOUNTER — Ambulatory Visit: Payer: Medicaid Other

## 2020-10-13 ENCOUNTER — Ambulatory Visit: Payer: Self-pay

## 2020-10-13 NOTE — Lactation Note (Signed)
This note was copied from a baby's chart. Lactation Consultation Note  Patient Name: [name] [name] [id number] Date: 10/13/2020 Reason for consult: Follow-up assessment;NICU baby  LC to room for follow up visit.  Mom is pleased that her baby is demonstrating feeding cues. She bf for 7 minutes last night and would like to increase bf practice. Mom continues to pump 8xday with 3-4 oz yield per pumping. Mom would like breastfeeding appointment tomorrow at 1400. RN ok'ed. Mom to prepump.   Consult Status Consult Status: Follow-up Date: 10/14/20 Follow-up type: In-patient    [name] 10/13/2020, 10:43 AM

## 2020-10-14 ENCOUNTER — Ambulatory Visit: Payer: Self-pay

## 2020-10-14 NOTE — Lactation Note (Signed)
This note was copied from a baby's chart. Lactation Consultation Note  Patient Name: [name] [name] [id number]'R Date: 10/14/2020 Reason for consult: Follow-up assessment;Mother's request;NICU baby  LC to room for feeding attempt. Baby did not wake with pestering. Left mom and baby sts. RN to gavage feeding. Will return prn to re-challenge. Mom continues to pump without difficulty.  Feeding Feeding Type: Breast Milk  LATCH Score Latch: Too sleepy or reluctant, no latch achieved, no sucking elicited.  Audible Swallowing: None  Type of Nipple: Everted at rest and after stimulation  Comfort (Breast/Nipple): Soft / non-tender  Hold (Positioning): Full assist, staff holds infant at breast  LATCH Score: 4  Interventions Interventions: Breast feeding basics reviewed;Support pillows;Assisted with latch;Position options;Skin to skin;Expressed milk;Breast compression;Adjust position    Consult Status Consult Status: Follow-up Date: 10/15/20 Follow-up type: In-patient    [name] 10/14/2020, 3:08 PM

## 2020-10-25 ENCOUNTER — Ambulatory Visit: Payer: Self-pay

## 2020-10-25 NOTE — Lactation Note (Signed)
This note was copied from a baby's chart. Lactation Consultation Note  Patient Name: [name] [name] Date: 10/25/2020 Reason for consult: Follow-up assessment  1044 - 1100 - I followed up with [name] [name]. She is staying overnight with her baby, [name] [name].  [name] [name] is now 39 and 2 and 8 lbs. [name] [name] states that she is improving with taking a bottle.  [name] [name] was pumping upon entry. She states that typically she can fill her storage containers (8 ounces/combined), but in the last two days, she has seen a decrease in her production. She attributes this to being exhausted and not hydrating enough. She states that [name] [name] has been more fussy at night these last few nights, and she has not slept as well.  Even with this concern, [name] [name] pumped 6 ounces at this visit (3 per side). I praised her for her dedication and provided encouragement that she is still going strong. I recommended naps and to drink plenty of water. We also discussed possible foods/herbs that might help boost her milk production while she is trying to get back to her normal pumping volume.  [name] [name] states that baby [name] [name] received a formula bottle this am due to a miscommunication. The lab was out of her EBM, but she was not notified in time to bring in some pumped milk. She expressed disappointment. I spoke with her RN about this, and the RN stated that they would try to notify [name] [name] when her milk was getting low.  [name] [name] made an appointment for lactation to see her tomorrow (12/3) at the noon feeding. She would like latch assistance.  Maternal Data Formula Feeding for Exclusion: No  Feeding Feeding Type: Formula Nipple Type: Dr. Levert Feinstein Preemie  Interventions Interventions: Breast feeding basics reviewed;DEBP  Lactation Tools Discussed/Used Pump Review: Setup, frequency, and cleaning   Consult Status Consult Status: Follow-up Date: 10/26/20 Follow-up type: In-patient    Walker Shadow 10/25/2020, 11:09 AM

## 2020-10-26 ENCOUNTER — Ambulatory Visit: Payer: Self-pay

## 2020-10-26 NOTE — Lactation Note (Signed)
This note was copied from a baby's chart. LC to room for 1500 feeding apt. Parents present but leaving soon because of an unexpected appointment outside of the hospital. We spoke briefly about mom's pumping concerns and rescheduled the feeding appointment until tomorrow at 1200. Patient was provided with the opportunity to ask questions. All concerns were addressed.  Will plan follow up visit.    [name] 10/26/2020, 2:36 PM

## 2020-10-27 ENCOUNTER — Ambulatory Visit: Payer: Self-pay

## 2020-10-27 NOTE — Lactation Note (Signed)
This note was copied from a baby's chart. Patient Name: [name] [name] [id number] Date: 10/27/2020 Reason for consult: Follow-up assessment;Mother's request;NICU baby  Infant Data and Feeding Corrected age: 28+4 Current feeding method: gavage and po  LATCH Score: 9  Lactation Tools  Tools: Nipple Shields Nipple shield size: 20  Lactation Consultation Note LC to infant's room for scheduled feeding consult. Mother pre-pumped about 50% of normal pumping volume 30 minutes before consult. LC assisted mother with positioning baby in football hold using nipple shield. After initial help, mother was able to bf independently for 15 minutes on R breast and 7 minutes on L breast. Mother used breast compressions to keep baby sucking/swallowing rhythmically. Infant was satisfied p bf and +milk was visible in shield and on baby's mouth.   After infant's feeding, RN, mother, and LC discussed the continued need for pre-pumping. The consensus was to discontinue pre-pumping unless infant has difficulty with letdown or flow. Mother satisfied with this decision. Mother was provided with the opportunity to ask questions. All concerns were addressed.  Mother requests continued LC support tomorrow at 1200 or 1500 feeding.  Consult Status Consult Status: Follow-up Date: 10/28/20 Follow-up type: In-patient (1200 observed feeding)   [name], MA IBCLC 10/27/2020, 12:59 PM

## 2020-10-28 ENCOUNTER — Ambulatory Visit: Payer: Self-pay

## 2020-10-28 NOTE — Lactation Note (Addendum)
This note was copied from a baby's chart. Lactation Consultation Note  Patient Name: [name] [name] Date: 10/28/2020 Reason for consult: Follow-up assessment;NICU baby;Term  [facility] consulted for positioning and latching to the breast.  Baby AGA [redacted]w[redacted]d at 80 weeks old.    Mom has been consistently pumping and has a good milk supply 4-8 oz per pumping.  Baby woke earlier than feeding time and was crying loudly for 10 mins prior to feeding.    When Mom arrived, baby had been sucking on pacifier to calm down. Mom last pumped 2 hrs prior to feeding.  No need to pre-pump per SLP and LC yesterday.  Baby positioned on left breast in football hold.  Baby not opening her mouth very widely.  Baby unable to attain a deep latch to breast.    Initiated a 20 mm nipple shield and baby latched and eventually settle down and relaxed her mouth to grasp deeper to breast.  Taught Mom to use alternate breast compression and baby consistently sucked/swallowed for 15 mins before coming off on her own appearing contented.  Baby paced herself and seemed comfortable sucking/swallowing/breathing with Mom's milk flow at the breast.  After burping baby, tried to latch baby to right breast, but baby fussy and wouldn't latch.  Mom placed baby STS on her chest and she settled down.    Mom encouraged to pump whenever baby is supplemented after breastfeeding.  Pump after breastfeeding would increase her milk supply, so if Mom is concerned about her supply, she should try to.   Mom states she has been sleeping better and staying hydrated more and her milk supply has rebounded.    Mom praised for how well she is doing with pumping and how well [name] did at the breast.    Feeding Feeding Type: Breast Fed Nipple Type: Dr. Levert Feinstein Preemie  LATCH Score Latch: Grasps breast easily, tongue down, lips flanged, rhythmical sucking.  Audible Swallowing: Spontaneous and intermittent  Type of Nipple: Everted at rest  and after stimulation  Comfort (Breast/Nipple): Soft / non-tender  Hold (Positioning): Assistance needed to correctly position infant at breast and maintain latch.  LATCH Score: 9  Interventions Interventions: Breast feeding basics reviewed;Assisted with latch;Skin to skin;Breast massage;Hand express;Breast compression;Adjust position;Support pillows;Position options;Expressed milk;DEBP  Lactation Tools Discussed/Used Tools: Pump;Nipple Dorris Carnes;Bottle Nipple shield size: 20 Breast pump type: Double-Electric Breast Pump   Consult Status Consult Status: Follow-up Date: 10/29/20 Follow-up type: In-patient    Judee Clara 10/28/2020, 12:33 PM

## 2020-10-30 ENCOUNTER — Ambulatory Visit: Payer: Self-pay

## 2020-10-30 NOTE — Lactation Note (Signed)
This note was copied from a baby's chart. Patient Name: [name] [name] [id number] Date: 10/30/2020 Reason for consult: Mother's request   Infant Data and Feeding 33+1 GA 40+0 CGA  Maternal Data     Lactation Tools Discussed/Used Nipple shield size: 20  Lactation Consultation Note  LC paged to room for observed bf. Mom is no longer pre-pumping before breastfeeding. LC assisted with positioning in football hold with nipple shield. Baby bf for 9 minutes with audible swallows. She came off breast and fell asleep on mom's chest an dresisted attempts of encouragement to bf on opposite breast. Mom admits breast softening p bf.   Consult Status Consult Status: Follow-up Date: 10/31/20 Follow-up type: In-patient    [name], MA IBCLC 10/30/2020, 3:28 PM

## 2021-03-04 ENCOUNTER — Encounter (HOSPITAL_BASED_OUTPATIENT_CLINIC_OR_DEPARTMENT_OTHER): Payer: Self-pay | Admitting: Oral Surgery

## 2021-03-04 ENCOUNTER — Other Ambulatory Visit: Payer: Self-pay

## 2021-03-11 ENCOUNTER — Other Ambulatory Visit (HOSPITAL_COMMUNITY)
Admission: RE | Admit: 2021-03-11 | Discharge: 2021-03-11 | Disposition: A | Discharge status: Home / Self Care | Payer: Medicaid Other | Source: Ambulatory Visit | Attending: Oral Surgery | Admitting: Oral Surgery

## 2021-03-11 DIAGNOSIS — Z01812 Encounter for preprocedural laboratory examination: Secondary | ICD-10-CM | POA: Diagnosis not present

## 2021-03-11 DIAGNOSIS — Z20822 Contact with and (suspected) exposure to covid-19: Secondary | ICD-10-CM | POA: Diagnosis not present

## 2021-03-11 LAB — SARS CORONAVIRUS 2 (TAT 6-24 HRS): SARS Coronavirus 2: NEGATIVE

## 2021-03-12 NOTE — H&P (Signed)
  [name], [name] is a 30 yo who was referred by general dentist for evaluation of third molars.    CC: Pain upper right  Past Medical History: Smoker, Diabetes, Obese  Medications: None   Allergies: Penicillin  Surgeries: None  Social:   Tobacco:occasional black and mild cigar  Alcohol:   Drug use:   Exam: BMI 35  Erupted teeth # 1, 16, Large decay #1.  Soft tissue impacted teeth #17, 32. No purulence, edema, fluctuance. Class 1 occlusion. Mallampati 1. Oral cancer screening negative. Pharynx clear. no lymphadenopathy   Pan: Erupted teeth # 1 with decay, 16, Impacted teeth #17, 32.    Assessment: ASA 2 . Patient with erupted teeth #1, 16,  impacted teeth #17, 32, no pericoronitis .    Plan: Extraction teeth 1, 16, 17, and 32, IV sedation. Risks and complications discussed.   Consent reviewed. All questions answered. Pre-op instructions given.     Rx: None  [name] [name], DMD

## 2021-03-13 ENCOUNTER — Ambulatory Visit (HOSPITAL_BASED_OUTPATIENT_CLINIC_OR_DEPARTMENT_OTHER)
Admission: RE | Admit: 2021-03-13 | Discharge: 2021-03-13 | Disposition: A | Discharge status: Home / Self Care | Payer: Medicaid Other | Attending: Oral Surgery | Admitting: Oral Surgery

## 2021-03-13 ENCOUNTER — Ambulatory Visit (HOSPITAL_BASED_OUTPATIENT_CLINIC_OR_DEPARTMENT_OTHER): Payer: Medicaid Other | Admitting: Anesthesiology

## 2021-03-13 ENCOUNTER — Other Ambulatory Visit: Payer: Self-pay

## 2021-03-13 ENCOUNTER — Encounter (HOSPITAL_BASED_OUTPATIENT_CLINIC_OR_DEPARTMENT_OTHER): Payer: Self-pay | Admitting: Oral Surgery

## 2021-03-13 ENCOUNTER — Encounter (HOSPITAL_BASED_OUTPATIENT_CLINIC_OR_DEPARTMENT_OTHER)
Admission: RE | Disposition: A | Discharge status: Home / Self Care | Payer: Self-pay | Source: Home / Self Care | Attending: Oral Surgery

## 2021-03-13 DIAGNOSIS — Z87891 Personal history of nicotine dependence: Secondary | ICD-10-CM | POA: Diagnosis not present

## 2021-03-13 DIAGNOSIS — K011 Impacted teeth: Secondary | ICD-10-CM | POA: Diagnosis not present

## 2021-03-13 DIAGNOSIS — Z880 Allergy status to penicillin: Secondary | ICD-10-CM | POA: Insufficient documentation

## 2021-03-13 HISTORY — PX: TOOTH EXTRACTION: SHX859

## 2021-03-13 LAB — POCT PREGNANCY, URINE: Preg Test, Ur: NEGATIVE

## 2021-03-13 SURGERY — EXTRACTION, TOOTH, MOLAR
Anesthesia: General | Site: Mouth | Laterality: Bilateral

## 2021-03-13 MED ORDER — MEPERIDINE HCL 25 MG/ML IJ SOLN: 6.2500 mg | INTRAMUSCULAR | Status: DC | PRN

## 2021-03-13 MED ORDER — PROPOFOL 10 MG/ML IV BOLUS
INTRAVENOUS | Status: DC | PRN
  Administered 2021-03-13: 200 mg via INTRAVENOUS

## 2021-03-13 MED ORDER — SUGAMMADEX SODIUM 500 MG/5ML IV SOLN
INTRAVENOUS | Status: DC | PRN
  Administered 2021-03-13: 600 mg via INTRAVENOUS
  Administered 2021-03-13: 500 mg via INTRAVENOUS

## 2021-03-13 MED ORDER — SODIUM CHLORIDE 0.9 % IV SOLN
INTRAVENOUS | Status: AC | PRN
  Administered 2021-03-13: 200 mL

## 2021-03-13 MED ORDER — FENTANYL CITRATE (PF) 100 MCG/2ML IJ SOLN
INTRAMUSCULAR | Status: DC | PRN
  Administered 2021-03-13 (×3): 50 ug via INTRAVENOUS

## 2021-03-13 MED ORDER — AMISULPRIDE (ANTIEMETIC) 5 MG/2ML IV SOLN: 10.0000 mg | Freq: Once | INTRAVENOUS | Status: DC | PRN

## 2021-03-13 MED ORDER — ROCURONIUM BROMIDE 100 MG/10ML IV SOLN
INTRAVENOUS | Status: DC | PRN
  Administered 2021-03-13: 100 mg via INTRAVENOUS

## 2021-03-13 MED ORDER — PROMETHAZINE HCL 25 MG/ML IJ SOLN: 6.2500 mg | INTRAMUSCULAR | Status: DC | PRN

## 2021-03-13 MED ORDER — DEXAMETHASONE SODIUM PHOSPHATE 4 MG/ML IJ SOLN
INTRAMUSCULAR | Status: DC | PRN
  Administered 2021-03-13: 10 mg via INTRAVENOUS

## 2021-03-13 MED ORDER — HYDROMORPHONE HCL 1 MG/ML IJ SOLN: 0.2500 mg | INTRAMUSCULAR | Status: DC | PRN

## 2021-03-13 MED ORDER — LIDOCAINE HCL (CARDIAC) PF 100 MG/5ML IV SOSY
PREFILLED_SYRINGE | INTRAVENOUS | Status: DC | PRN
  Administered 2021-03-13: 100 mg via INTRAVENOUS

## 2021-03-13 MED ORDER — MIDAZOLAM HCL 2 MG/2ML IJ SOLN
INTRAMUSCULAR | Status: AC
  Filled 2021-03-13: qty 2

## 2021-03-13 MED ORDER — LIDOCAINE 2% (20 MG/ML) 5 ML SYRINGE
INTRAMUSCULAR | Status: AC
  Filled 2021-03-13: qty 5

## 2021-03-13 MED ORDER — OXYCODONE HCL 5 MG PO TABS
5.0000 mg | ORAL_TABLET | Freq: Once | ORAL | Status: AC | PRN
  Administered 2021-03-13: 5 mg via ORAL

## 2021-03-13 MED ORDER — ROCURONIUM BROMIDE 10 MG/ML (PF) SYRINGE
PREFILLED_SYRINGE | INTRAVENOUS | Status: AC
  Filled 2021-03-13: qty 10

## 2021-03-13 MED ORDER — HYDROCODONE-ACETAMINOPHEN 5-325 MG PO TABS: 1.0000 | ORAL_TABLET | Freq: Four times a day (QID) | ORAL | 0 refills | Status: DC | PRN

## 2021-03-13 MED ORDER — LACTATED RINGERS IV SOLN: INTRAVENOUS | Status: DC

## 2021-03-13 MED ORDER — CLINDAMYCIN PHOSPHATE 600 MG/50ML IV SOLN
600.0000 mg | INTRAVENOUS | Status: AC
  Administered 2021-03-13: 600 mg via INTRAVENOUS

## 2021-03-13 MED ORDER — MIDAZOLAM HCL 5 MG/5ML IJ SOLN
INTRAMUSCULAR | Status: DC | PRN
  Administered 2021-03-13: 2 mg via INTRAVENOUS

## 2021-03-13 MED ORDER — FENTANYL CITRATE (PF) 100 MCG/2ML IJ SOLN
INTRAMUSCULAR | Status: AC
  Filled 2021-03-13: qty 2

## 2021-03-13 MED ORDER — LIDOCAINE-EPINEPHRINE 2 %-1:100000 IJ SOLN
INTRAMUSCULAR | Status: DC | PRN
  Administered 2021-03-13: 13 mL via INTRADERMAL

## 2021-03-13 MED ORDER — OXYCODONE HCL 5 MG PO TABS
ORAL_TABLET | ORAL | Status: AC
  Filled 2021-03-13: qty 1

## 2021-03-13 MED ORDER — PROPOFOL 10 MG/ML IV BOLUS
INTRAVENOUS | Status: AC
  Filled 2021-03-13: qty 20

## 2021-03-13 MED ORDER — OXYCODONE HCL 5 MG/5ML PO SOLN: 5.0000 mg | Freq: Once | ORAL | Status: AC | PRN

## 2021-03-13 MED ORDER — CLINDAMYCIN PHOSPHATE 600 MG/50ML IV SOLN
INTRAVENOUS | Status: AC
  Filled 2021-03-13: qty 50

## 2021-03-13 SURGICAL SUPPLY — 40 items
BLADE SURG 15 STRL LF DISP TIS (BLADE) ×1 IMPLANT
BLADE SURG 15 STRL SS (BLADE) ×2
BNDG EYE OVAL (GAUZE/BANDAGES/DRESSINGS) IMPLANT
BUR CROSS CUT FISSURE 1.6 (BURR) ×2 IMPLANT
BUR EGG ELITE 4.0 (BURR) IMPLANT
CANISTER SUCT 1200ML W/VALVE (MISCELLANEOUS) ×2 IMPLANT
COVER BACK TABLE 60X90IN (DRAPES) ×2 IMPLANT
COVER MAYO STAND STRL (DRAPES) ×2 IMPLANT
COVER WAND RF STERILE (DRAPES) IMPLANT
DECANTER SPIKE VIAL GLASS SM (MISCELLANEOUS) ×2 IMPLANT
DRAPE U-SHAPE 76X120 STRL (DRAPES) ×2 IMPLANT
GAUZE PACKING FOLDED 2  STR (GAUZE/BANDAGES/DRESSINGS) ×2
GAUZE PACKING FOLDED 2 STR (GAUZE/BANDAGES/DRESSINGS) ×1 IMPLANT
GAUZE PACKING IODOFORM 1/4X15 (PACKING) IMPLANT
GLOVE SURG ENC MOIS LTX SZ6.5 (GLOVE) ×2 IMPLANT
GLOVE SURG ENC MOIS LTX SZ8 (GLOVE) ×2 IMPLANT
GLOVE SURG UNDER POLY LF SZ6.5 (GLOVE) ×2 IMPLANT
GOWN STRL REUS W/ TWL LRG LVL3 (GOWN DISPOSABLE) ×1 IMPLANT
GOWN STRL REUS W/ TWL XL LVL3 (GOWN DISPOSABLE) ×1 IMPLANT
GOWN STRL REUS W/TWL LRG LVL3 (GOWN DISPOSABLE) ×2
GOWN STRL REUS W/TWL XL LVL3 (GOWN DISPOSABLE) ×2
IV NS 500ML (IV SOLUTION) ×2
IV NS 500ML BAXH (IV SOLUTION) ×1 IMPLANT
NEEDLE HYPO 22GX1.5 SAFETY (NEEDLE) ×2 IMPLANT
NS IRRIG 1000ML POUR BTL (IV SOLUTION) ×2 IMPLANT
PACK BASIN DAY SURGERY FS (CUSTOM PROCEDURE TRAY) ×2 IMPLANT
SLEEVE IRRIGATION ELITE 7 (MISCELLANEOUS) ×2 IMPLANT
SLEEVE SCD COMPRESS KNEE MED (STOCKING) ×2 IMPLANT
SPONGE SURGIFOAM ABS GEL 12-7 (HEMOSTASIS) IMPLANT
STRIP CLOSURE SKIN 1/2X4 (GAUZE/BANDAGES/DRESSINGS) IMPLANT
SUT CHROMIC 3 0 PS 2 (SUTURE) ×2 IMPLANT
SYR 20ML LL LF (SYRINGE) IMPLANT
SYR BULB EAR ULCER 3OZ GRN STR (SYRINGE) ×2 IMPLANT
SYR CONTROL 10ML LL (SYRINGE) ×2 IMPLANT
TOOTHBRUSH ADULT (PERSONAL CARE ITEMS) IMPLANT
TOWEL GREEN STERILE FF (TOWEL DISPOSABLE) ×2 IMPLANT
TRAY DSU PREP LF (CUSTOM PROCEDURE TRAY) IMPLANT
TUBE CONNECTING 20X1/4 (TUBING) ×2 IMPLANT
TUBING IRRIGATION (MISCELLANEOUS) ×2 IMPLANT
YANKAUER SUCT BULB TIP NO VENT (SUCTIONS) ×2 IMPLANT

## 2021-03-13 NOTE — Anesthesia Preprocedure Evaluation (Addendum)
Anesthesia Evaluation  Patient identified by MRN, date of birth, ID band Patient awake    Reviewed: Allergy & Precautions, NPO status , Patient's Chart, lab work & pertinent test results  Airway Mallampati: II  TM Distance: >3 FB Neck ROM: Full    Dental no notable dental hx.    Pulmonary former smoker,    Pulmonary exam normal breath sounds clear to auscultation       Cardiovascular Normal cardiovascular exam Rhythm:Regular Rate:Normal     Neuro/Psych negative neurological ROS  negative psych ROS   GI/Hepatic negative GI ROS, Neg liver ROS,   Endo/Other  BMI 40  Renal/GU negative Renal ROS  negative genitourinary   Musculoskeletal negative musculoskeletal ROS (+)   Abdominal (+) + obese,   Peds negative pediatric ROS (+)  Hematology negative hematology ROS (+) hct 42.9, plt 256   Anesthesia Other Findings   Reproductive/Obstetrics G3P1 at 33wks, PPROM Had epidural with prior delivery and worked well                            Anesthesia Physical  Anesthesia Plan  ASA: II  Anesthesia Plan: General   Post-op Pain Management:    Induction: Intravenous  PONV Risk Score and Plan: 3 and Ondansetron, Dexamethasone, Midazolam and Treatment may vary due to age or medical condition  Airway Management Planned: Nasal ETT  Additional Equipment: None  Intra-op Plan:   Post-operative Plan: Extubation in OR  Informed Consent: I have reviewed the patients History and Physical, chart, labs and discussed the procedure including the risks, benefits and alternatives for the proposed anesthesia with the patient or authorized representative who has indicated his/her understanding and acceptance.     Dental advisory given  Plan Discussed with: CRNA  Anesthesia Plan Comments:        Anesthesia Quick Evaluation

## 2021-03-13 NOTE — Anesthesia Procedure Notes (Signed)
Procedure Name: Intubation Date/Time: 03/13/2021 11:49 AM Performed by: Maryella Shivers, CRNA Pre-anesthesia Checklist: Patient identified, Emergency Drugs available, Suction available and Patient being monitored Patient Re-evaluated:Patient Re-evaluated prior to induction Oxygen Delivery Method: Circle system utilized Preoxygenation: Pre-oxygenation with 100% oxygen Induction Type: IV induction Ventilation: Mask ventilation without difficulty Laryngoscope Size: Mac and 3 Grade View: Grade I Nasal Tubes: Nasal prep performed, Nasal Rae, Right and Magill forceps- large, utilized Tube size: 6.5 mm Placement Confirmation: ETT inserted through vocal cords under direct vision,  positive ETCO2 and breath sounds checked- equal and bilateral Secured at: 25 cm Tube secured with: Tape Dental Injury: Teeth and Oropharynx as per pre-operative assessment

## 2021-03-13 NOTE — Discharge Instructions (Signed)

## 2021-03-13 NOTE — Op Note (Signed)
03/13/2021  12:12 PM  PATIENT:  [name] [name]  30 y.o. female  PRE-OPERATIVE DIAGNOSIS:  NONRESTORABLE/ IMPACTED TEETH # 1, 16, 17, 32  POST-OPERATIVE DIAGNOSIS:  SAME  PROCEDURE:  Procedure(s): EXTRACTION TEETH # 1, 16, 17, 32  SURGEON:  Surgeon(s): Ocie Doyne, DMD  ANESTHESIA:   local and general  EBL:  minimal  DRAINS: none   SPECIMEN:  No Specimen  COUNTS:  YES  PLAN OF CARE: Discharge to home after PACU  PATIENT DISPOSITION:  PACU - hemodynamically stable.   PROCEDURE DETAILS: Dictation # [id number]  Georgia Lopes, DMD 03/13/2021 12:12 PM

## 2021-03-13 NOTE — Transfer of Care (Signed)
Immediate Anesthesia Transfer of Care Note  Patient: [name] [name]  Procedure(s) Performed: EXTRACTION MOLARS (Bilateral Mouth)  Patient Location: PACU  Anesthesia Type:General  Level of Consciousness: sedated  Airway & Oxygen Therapy: Patient Spontanous Breathing and Patient connected to face mask oxygen  Post-op Assessment: Report given to RN and Post -op Vital signs reviewed and stable  Post vital signs: Reviewed and stable  Last Vitals:  Vitals Value Taken Time  BP 135/82 03/13/21 1221  Temp    Pulse 101 03/13/21 1224  Resp 17 03/13/21 1224  SpO2 100 % 03/13/21 1224  Vitals shown include unvalidated device data.  Last Pain:  Vitals:   03/13/21 1036  TempSrc: Oral  PainSc: 0-No pain      Patients Stated Pain Goal: 8 (03/13/21 1036)  Complications: No complications documented.

## 2021-03-13 NOTE — H&P (Signed)
Anesthesia H&P Update: History and Physical Exam reviewed; patient is OK for planned anesthetic and procedure. ? ?

## 2021-03-13 NOTE — H&P (Signed)
H&P documentation  -History and Physical Reviewed  -Patient has been re-examined  -No change in the plan of care  [name] [name]  

## 2021-03-13 NOTE — Anesthesia Postprocedure Evaluation (Signed)
Anesthesia Post Note  Patient: [name] [name]  Procedure(s) Performed: EXTRACTION MOLARS (Bilateral Mouth)     Patient location during evaluation: PACU Anesthesia Type: General Level of consciousness: awake and alert Pain management: pain level controlled Vital Signs Assessment: post-procedure vital signs reviewed and stable Respiratory status: spontaneous breathing, nonlabored ventilation and respiratory function stable Cardiovascular status: blood pressure returned to baseline and stable Postop Assessment: no apparent nausea or vomiting Anesthetic complications: no   No complications documented.  Last Vitals:  Vitals:   03/13/21 1312 03/13/21 1339  BP:  120/77  Pulse: 74 76  Resp: 15 16  Temp:  36.6 C  SpO2: 98% 98%    Last Pain:  Vitals:   03/13/21 1336  TempSrc:   PainSc: 4                  Lowella Curb

## 2021-03-14 ENCOUNTER — Encounter (HOSPITAL_BASED_OUTPATIENT_CLINIC_OR_DEPARTMENT_OTHER): Payer: Self-pay | Admitting: Oral Surgery

## 2021-03-14 NOTE — Op Note (Signed)
[name] [name], NIERENBERG MEDICAL RECORD NO: [id number] ACCOUNT NO: [id number] DATE OF BIRTH: Apr 09, 1991 FACILITY: [facility] LOCATION: [facility] PHYSICIAN: Georgia Lopes, DDS  Operative Report   DATE OF PROCEDURE: 03/13/2021  PREOPERATIVE DIAGNOSIS:  Nonrestorable impacted teeth #1, 16, 17 and 32.  PROCEDURE:  Extraction of teeth 1, 16, 17 and 32.  SURGEON:  Georgia Lopes, DDS.  ANESTHESIA:  General, Dr. Hyacinth Meeker attending, nasal intubation.  DESCRIPTION OF PROCEDURE:  The patient was taken to the operating room and placed on the table in supine position.  General anesthesia was administered intravenously.  Nasal endotracheal tube was placed and secured.  The eyes were protected.  The patient  was draped for surgery.  Timeout was performed.  The posterior pharynx was suctioned and a throat pack was placed, 2% lidocaine 1:100,000 epinephrine was infiltrated in the inferior alveolar block on the right and left sides and buccal and palatal  infiltration on the right and left maxilla around the teeth to be removed.  Bite block was placed on the right side of the mouth, a sweetheart retractor was used to retract the tongue.  A #15 blade was used to make an incision around teeth #16 and 17.   The periosteum was reflected from around these teeth.  The teeth were elevated with a 301 elevator.  Tooth #16 was removed with dental forceps.  Tooth #17 required removal of buccal bone using the Stryker handpiece under irrigation and then the tooth was  elevated and removed with dental forceps.  The sockets were then curetted, irrigated and tooth #17 was closed with 3-0 chromic.  The bite block and sweetheart retractor were repositioned to the other side of the mouth and a 15 blade was used to make an  incision around teeth #1 and 32.  The periosteum was reflected with a periosteal elevator.  Bone was removed from around tooth #32.  Then, the teeth were elevated with a 301 elevator and removed from the mouth with  a dental forceps.  The sockets were  irrigated and curetted.  Tooth #32 was sutured with 3-0 chromic, #1 and 16, did not require suturing.  The oral cavity was irrigated and suctioned.  The throat pack was removed.  The patient was left under the care of anesthesia for extubation and  transport to recovery room with plans for discharge home through day surgery.  ESTIMATED BLOOD LOSS:  Minimum.  COMPLICATIONS:  None.  SPECIMENS:  None.   SUJ D: 03/13/2021 12:15:06 pm T: 03/14/2021 4:48:00 am  JOB: [id number]/ [id number]

## 2021-08-28 ENCOUNTER — Encounter: Payer: Medicaid Other | Attending: Obstetrics and Gynecology | Admitting: Registered"

## 2021-08-28 ENCOUNTER — Encounter: Payer: Medicaid Other | Admitting: Registered"

## 2021-08-28 DIAGNOSIS — O24119 Pre-existing diabetes mellitus, type 2, in pregnancy, unspecified trimester: Secondary | ICD-10-CM | POA: Insufficient documentation

## 2021-08-28 NOTE — Progress Notes (Signed)
Virtual Visit via Video Note  I connected with [name] [name] on 08/28/21 at  3:30 PM EDT by a video enabled telemedicine application and verified that I am speaking with the correct person using two identifiers.  Location: Patient: at her sister's house Provider: [facility] [place]   I discussed the limitations of evaluation and management by telemedicine and the availability of in person appointments. The patient expressed understanding and agreed to proceed.   Patient was seen for Type 2 Diabetes in pregnancy self-management on 08/28/21  Start time 1530  and End time 1620   Estimated due date: 02/16/21 (40 weeks); [redacted]w[redacted]d  Clinical: Medications: metformin 500 mg bid (started @ 13 weeks), prenatal vitamin, Vit D Medical History: Type 2 diabetes, last pregnancy delivery at 33 weeks Labs: A1c (not available)% 07/02/21 per MD notes, patient not at goal.  Dietary and Lifestyle History: Patient states she remembers some GDM education from her last pregnancy and feels confident that with a refresher she can get back on track with her eating and control her blood sugar. Pt states she does not want to have to stick herself.   Pt states last 10 months has learned that many family members have T2DM and don't take care of themselves and she doesn't want to go down the same path.  Patient states she works from home. Pt reports she is having trouble with nausea and some of her food choices based on that.   Patient will benefit from focusing on balancing meals with protein and measuring her carbohydrate choices.  Physical Activity: 7x/week walking outside 30 min Stress: not assessed Sleep: not assessed  24 hr Recall: First Meal: banana, bagel Snack: ritz crackers Second meal:cucumbers, pepperoni pizza Snack: Third meal: rice ~1.5 c, baked chicken green beans (didn't have corn) Snack:  Beverages: water (Smart of Core), small cup juicy juice  NUTRITION  INTERVENTION  Nutrition education (E-1) on the following topics:   Initial Follow-up  [x]  []  Definition of Gestational Diabetes []  []  Why dietary management is important in controlling blood glucose []  []  Effects each nutrient has on blood glucose levels [x]  []  Simple carbohydrates vs complex carbohydrates []  []  Fluid intake [x]  []  Creating a balanced meal plan [x]  []  Carbohydrate counting  []  []  When to check blood glucose levels []  []  Proper blood glucose monitoring techniques []  []  Effect of stress and stress reduction techniques  []  []  Exercise effect on blood glucose levels, appropriate exercise during pregnancy []  []  Importance of limiting caffeine and abstaining from alcohol and smoking [x]  []  Medications used for blood sugar control during pregnancy []  []  Hypoglycemia and rule of 15 []  []  Postpartum self care  Patient already has a meter, is testing pre breakfast and 2 hours after each meal. FBS: 137 mg/dL Postprandial: mg/dL  If patient is started on insulin, Medicaid will likely cover the cost of CGM if included indication for Glucose readings needed for therapeutic treatment (insulin) in Type 2 diabetes.   Patient instructed to monitor glucose levels: FBS: 60 - ? 95 mg/dL (some clinics use 90 for cutoff) 1 hour: ? 140 mg/dL 2 hour: ? mg/dL  Patient received handouts: Nutrition Diabetes and Pregnancy Carbohydrate Counting List  The patient was advised to call back or seek an in-person evaluation if the symptoms worsen or if the condition fails to improve as anticipated.  I provided 50 minutes of non-face-to-face time during this encounter.  , RD, LDN, CDCES

## 2021-08-30 ENCOUNTER — Encounter: Payer: Self-pay | Admitting: Registered"

## 2021-09-05 ENCOUNTER — Other Ambulatory Visit: Payer: Medicaid Other

## 2021-09-17 ENCOUNTER — Other Ambulatory Visit: Payer: Self-pay | Admitting: Obstetrics and Gynecology

## 2021-09-17 DIAGNOSIS — Z363 Encounter for antenatal screening for malformations: Secondary | ICD-10-CM

## 2021-09-23 ENCOUNTER — Encounter: Payer: Self-pay | Admitting: *Deleted

## 2021-09-26 ENCOUNTER — Other Ambulatory Visit: Payer: Self-pay | Admitting: *Deleted

## 2021-09-26 ENCOUNTER — Other Ambulatory Visit: Payer: Self-pay

## 2021-09-26 ENCOUNTER — Other Ambulatory Visit: Payer: Self-pay | Admitting: Obstetrics and Gynecology

## 2021-09-26 ENCOUNTER — Ambulatory Visit: Payer: Medicaid Other | Admitting: *Deleted

## 2021-09-26 ENCOUNTER — Encounter: Payer: Self-pay | Admitting: *Deleted

## 2021-09-26 ENCOUNTER — Ambulatory Visit: Payer: Medicaid Other | Attending: Obstetrics and Gynecology

## 2021-09-26 VITALS — BP 120/66 | HR 96

## 2021-09-26 DIAGNOSIS — Z363 Encounter for antenatal screening for malformations: Secondary | ICD-10-CM | POA: Diagnosis not present

## 2021-09-26 DIAGNOSIS — O24112 Pre-existing diabetes mellitus, type 2, in pregnancy, second trimester: Secondary | ICD-10-CM

## 2021-10-23 ENCOUNTER — Ambulatory Visit: Payer: Medicaid Other | Admitting: *Deleted

## 2021-10-23 ENCOUNTER — Encounter: Payer: Self-pay | Admitting: *Deleted

## 2021-10-23 ENCOUNTER — Other Ambulatory Visit: Payer: Self-pay

## 2021-10-23 ENCOUNTER — Ambulatory Visit: Payer: Medicaid Other | Attending: Obstetrics and Gynecology

## 2021-10-23 VITALS — BP 112/67 | HR 84

## 2021-10-23 DIAGNOSIS — O24112 Pre-existing diabetes mellitus, type 2, in pregnancy, second trimester: Secondary | ICD-10-CM

## 2021-10-23 DIAGNOSIS — O09899 Supervision of other high risk pregnancies, unspecified trimester: Secondary | ICD-10-CM | POA: Diagnosis present

## 2021-10-23 DIAGNOSIS — O99212 Obesity complicating pregnancy, second trimester: Secondary | ICD-10-CM | POA: Diagnosis not present

## 2021-10-23 DIAGNOSIS — E669 Obesity, unspecified: Secondary | ICD-10-CM | POA: Diagnosis not present

## 2021-10-23 DIAGNOSIS — E119 Type 2 diabetes mellitus without complications: Secondary | ICD-10-CM

## 2021-10-23 DIAGNOSIS — Z3A23 23 weeks gestation of pregnancy: Secondary | ICD-10-CM

## 2021-10-24 ENCOUNTER — Other Ambulatory Visit: Payer: Self-pay | Admitting: *Deleted

## 2021-10-24 DIAGNOSIS — O24419 Gestational diabetes mellitus in pregnancy, unspecified control: Secondary | ICD-10-CM

## 2021-10-24 DIAGNOSIS — O99212 Obesity complicating pregnancy, second trimester: Secondary | ICD-10-CM

## 2021-10-24 DIAGNOSIS — O09892 Supervision of other high risk pregnancies, second trimester: Secondary | ICD-10-CM

## 2021-11-20 ENCOUNTER — Ambulatory Visit: Payer: Medicaid Other | Attending: Obstetrics

## 2021-11-20 ENCOUNTER — Other Ambulatory Visit: Payer: Self-pay

## 2021-11-20 ENCOUNTER — Ambulatory Visit: Payer: Medicaid Other | Admitting: *Deleted

## 2021-11-20 VITALS — BP 115/70 | HR 87

## 2021-11-20 DIAGNOSIS — O24112 Pre-existing diabetes mellitus, type 2, in pregnancy, second trimester: Secondary | ICD-10-CM | POA: Diagnosis present

## 2021-11-20 DIAGNOSIS — E669 Obesity, unspecified: Secondary | ICD-10-CM | POA: Diagnosis not present

## 2021-11-20 DIAGNOSIS — O24419 Gestational diabetes mellitus in pregnancy, unspecified control: Secondary | ICD-10-CM | POA: Insufficient documentation

## 2021-11-20 DIAGNOSIS — O09892 Supervision of other high risk pregnancies, second trimester: Secondary | ICD-10-CM | POA: Diagnosis present

## 2021-11-20 DIAGNOSIS — O99212 Obesity complicating pregnancy, second trimester: Secondary | ICD-10-CM | POA: Diagnosis present

## 2021-11-20 DIAGNOSIS — E119 Type 2 diabetes mellitus without complications: Secondary | ICD-10-CM

## 2021-11-20 DIAGNOSIS — Z3A27 27 weeks gestation of pregnancy: Secondary | ICD-10-CM

## 2021-11-21 ENCOUNTER — Other Ambulatory Visit: Payer: Self-pay | Admitting: *Deleted

## 2021-11-21 DIAGNOSIS — O24112 Pre-existing diabetes mellitus, type 2, in pregnancy, second trimester: Secondary | ICD-10-CM

## 2021-11-21 DIAGNOSIS — R638 Other symptoms and signs concerning food and fluid intake: Secondary | ICD-10-CM

## 2021-11-21 DIAGNOSIS — O09899 Supervision of other high risk pregnancies, unspecified trimester: Secondary | ICD-10-CM

## 2021-11-21 DIAGNOSIS — Z3689 Encounter for other specified antenatal screening: Secondary | ICD-10-CM

## 2021-11-24 NOTE — L&D Delivery Note (Signed)
Delivery Note Labor onset: 01/15/2022  Labor Onset Time: 1500 Complete dilation at 6:26 PM  Onset of pushing at 1826 FHR second stage Cat 2 Analgesia/Anesthesia intrapartum: unmedicated  Guided pushing with strong maternal urge. Delivery of head at 1839, shoulder dystocia not released with McRoberts, suprapubic pressure, hands and knees, lateral position, and failed attempt to release the posterior shoulder. Dr. Macon Large to bedside, head has been delivered for 2 min 45 sec. Body delivered after another 1 min 15 sec for a total of 4 min by Dr. Macon Large. Cord avulsed immediately at birth and did not bleed from fetal side. Infant immediately to NICU team. Cord noted in vagina and clamped by CNM. Brisk, bright red bleeding immediately after birth and prior to placenta. Tocolytics initiated: Pitocin, TXA, Methergine, and Cytotec for suspected abruption.    Nuchal cord: none.   Cord blood sample collected: Yes Arterial cord blood sample collected: Yes, 7.21  Placenta delivered Tomasa Blase, intact, only 2 vessels identified Placenta to path. Uterine tone firm, bleeding resolved with tocolytics  1st degree laceration identified. Hemostatic and not repaired Anesthesia: none QBL/EBL (mL): 700 Complications: shoulder dystocia, postpartum hemorrhage APGAR: APGAR (1 MIN): 1   APGAR (5 MINS): 5   APGAR (10 MINS): 7   Mom to postpartum.  Baby to NICU Baby girl "[name] [name]"  Roma Schanz MSN, CNM 01/15/2022, 8:26 PM

## 2021-12-19 IMAGING — US US MFM OB DETAIL+14 WK
1 series · 13 of 22 positions shown · non-contrast
Comparison: none

[Series 1: us mfm ob detail+14 wk · 22 acquisitions, 13 frames shown]
[im 1/22]
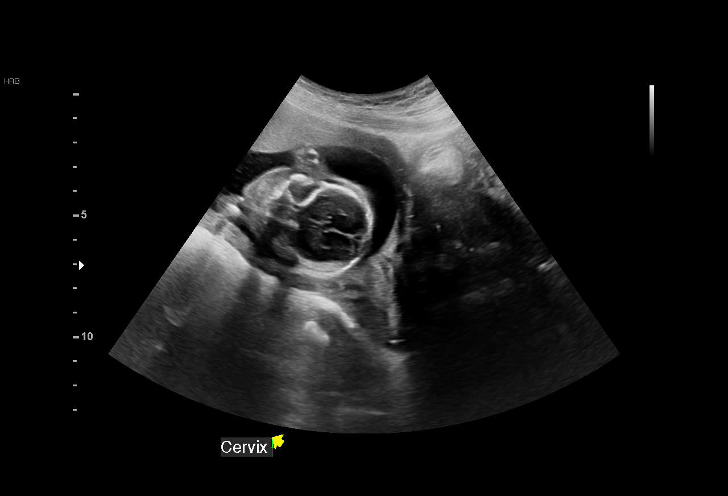
[im 3/22]
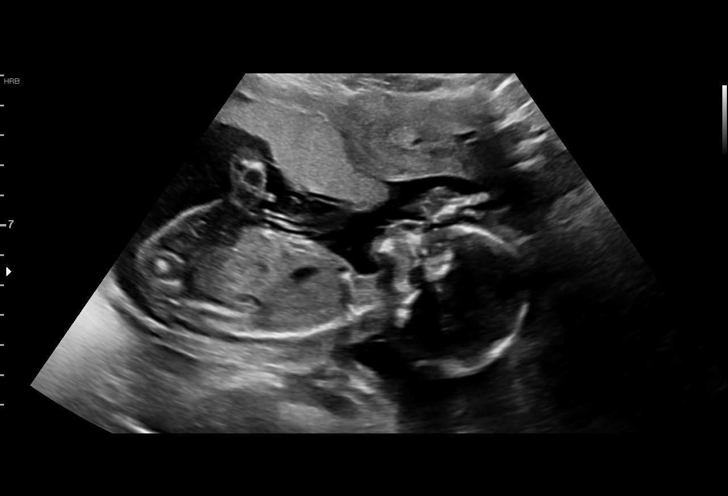
[im 5/22]
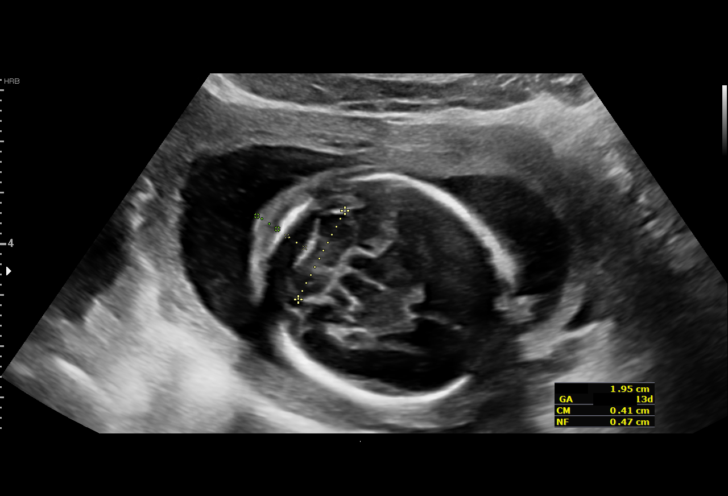
[im 6/22]
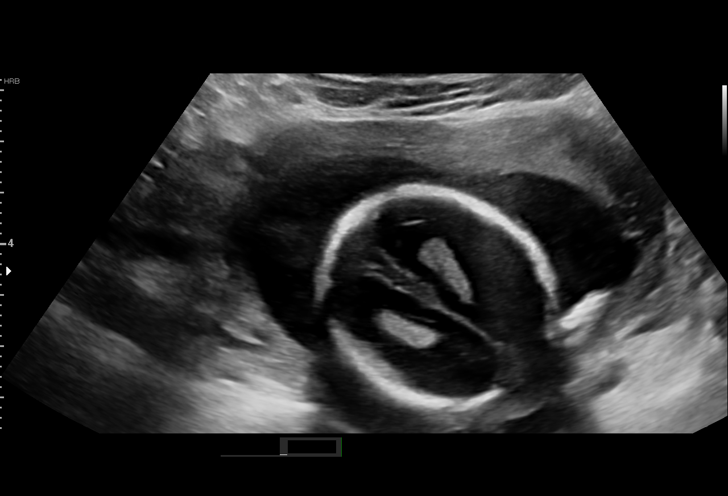
[im 8/22]
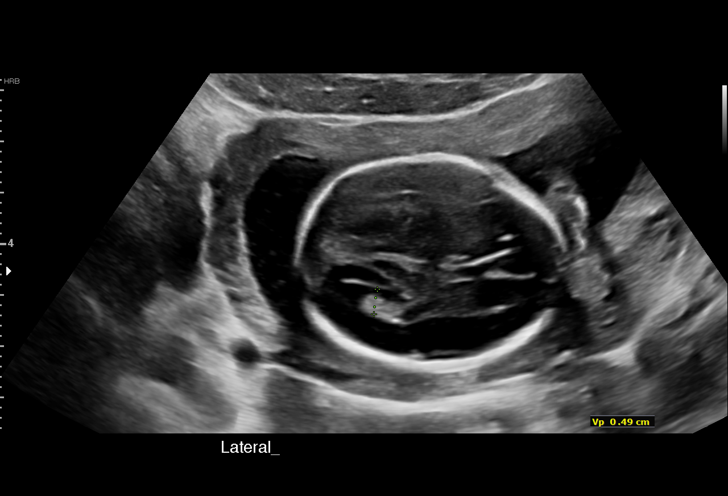
[im 10/22]
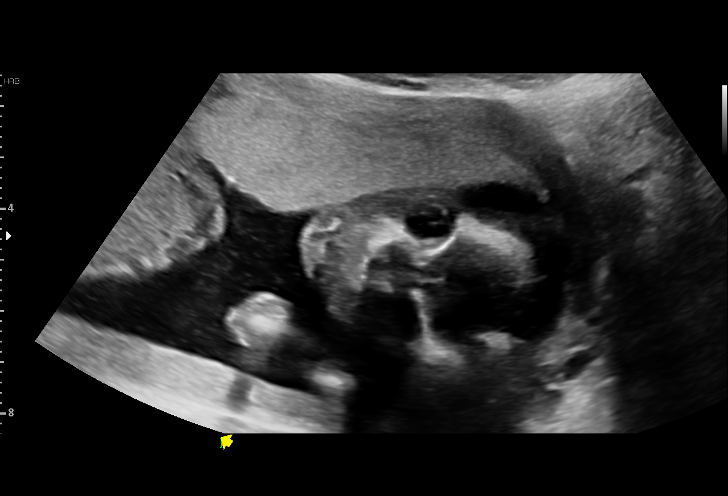
[im 12/22]
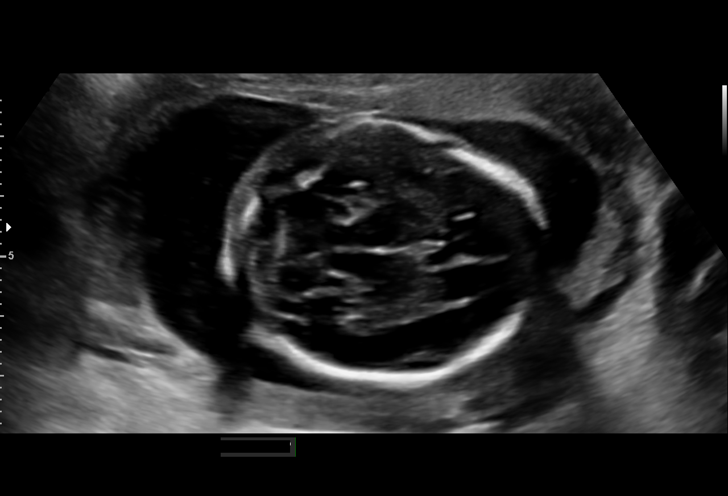
[im 13/22]
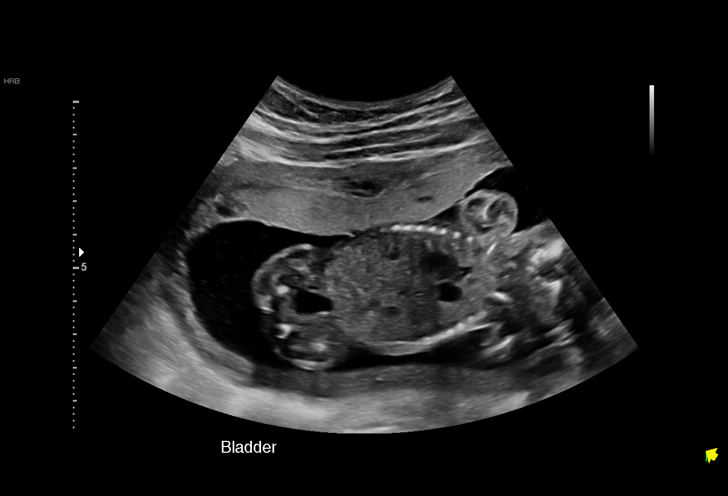
[im 15/22]
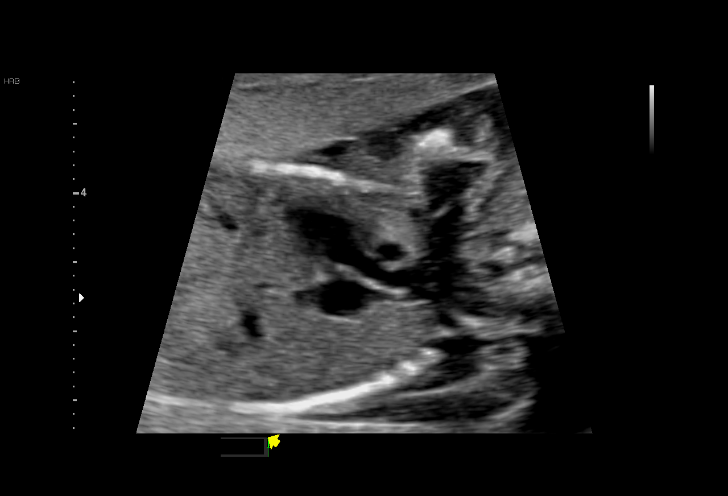
[im 17/22]
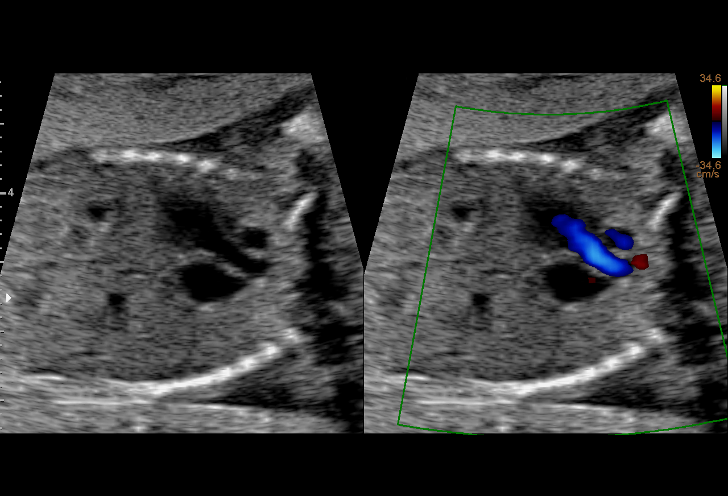
[im 18/22]
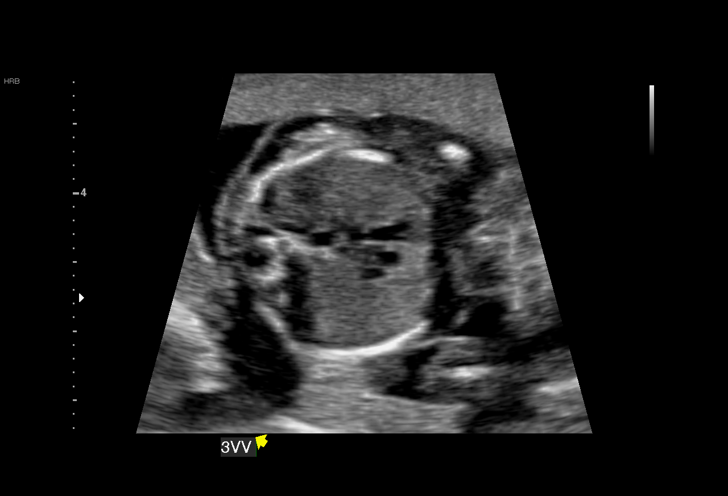
[im 20/22]
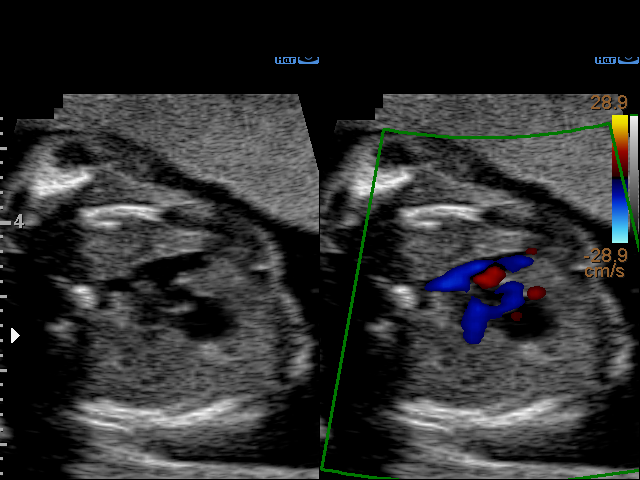
[im 22/22]
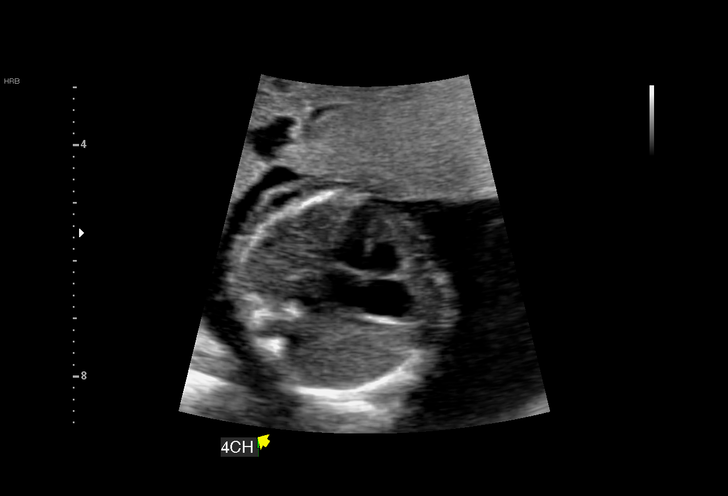

[13 of 22 positions shown; findings below may reference images not displayed]

Obstetrics &
                                                            Gynecology
                                                            [address]
                                                            [place].

Indications

 Pre-existing diabetes, type 2, in pregnancy,
 second trimester
 Encounter for antenatal screening for
 malformations
 Abnormal biochemical finding on antenatal
 screening of mother (NIPS - low FF, SMA
 carrier)
 Tobacco use complicating pregnancy,
 second trimester
 19 weeks gestation of pregnancy
Vital Signs

 BMI:
Fetal Evaluation

 Num Of Fetuses:         1
 Fetal Heart Rate(bpm):  161
 Cardiac Activity:       Observed
 Presentation:           Cephalic
 Placenta:               Anterior
 P. Cord Insertion:      Visualized, central
 Amniotic Fluid
 AFI FV:      Within normal limits

                             Largest Pocket(cm)

Biometry

 BPD:      41.1  mm     G. Age:  18w 3d         22  %    CI:        74.19   %    70 - 86
                                                         FL/HC:      18.2   %    16.1 -
 HC:      151.5  mm     G. Age:  18w 1d          7  %    HC/AC:      1.14        1.09 -
 AC:      132.5  mm     G. Age:  18w 5d         32  %    FL/BPD:     67.2   %
 FL:       27.6  mm     G. Age:  18w 3d         19  %    FL/AC:      20.8   %    20 - 24
 CER:      19.5  mm     G. Age:  19w 0d         34  %
 NFT:       4.7  mm

 LV:        4.9  mm
 CM:        4.1  mm

 Est. FW:     246  gm      0 lb 9 oz     17  %
OB History

 Gravidity:    2         Term:   1
 Living:       1
Gestational Age

 U/S Today:     18w 3d                                        EDD:   11/04/20
 Best:          19w 1d     Det. By:  Previous Ultrasound      EDD:   10/30/20
                                     (03/20/20)
Anatomy

 Cranium:               Appears normal         Aortic Arch:            Appears normal
 Cavum:                 Appears normal         Ductal Arch:            Appears normal
 Ventricles:            Appears normal         Diaphragm:              Appears normal
 Choroid Plexus:        Appears normal         Stomach:                Appears normal, left
                                                                       sided
 Cerebellum:            Appears normal         Abdomen:                Appears normal
 Posterior Fossa:       Appears normal         Abdominal Wall:         Appears nml (cord
                                                                       insert, abd wall)
 Nuchal Fold:           Appears normal         Cord Vessels:           Appears normal (3
                                                                       vessel cord)
 Face:                  Appears normal         Kidneys:                Appear normal
                        (orbits and profile)
 Lips:                  Appears normal         Bladder:                Appears normal
 Thoracic:              Appears normal         Spine:                  Not well visualized
 Heart:                 Appears normal         Upper Extremities:      Appears normal
                        (4CH, axis, and
                        situs)
 RVOT:                  Appears normal         Lower Extremities:      Appears normal
 LVOT:                  Appears normal

 Other:  Parents do not wish to know sex of fetus. Heels and 5th digit
         visualized. 3VV visualized. Technically difficult due to maternal
         habitus and fetal position. Fetus appears to be female.
Cervix Uterus Adnexa
 Cervix
 Length:           3.49  cm.
 Normal appearance by transabdominal scan.

 Uterus
 No abnormality visualized.

 Right Ovary
 Within normal limits.

 Left Ovary
 Within normal limits.

 Cul De Sac
 No free fluid seen.

 Adnexa
 No abnormality visualized.
Impression

 Single intrauterine pregnancy here for a detailed anatomy
 due to type 2 diabetes
 Normal anatomy with measurements consistent with dates
 There is good fetal movement and amniotic fluid volume
 Suboptimal views of the fetal anatomy were obtained
 secondary to fetal position.

 I reviewed today's study and recommend serial growth, fetal
 echocardiogram and initiation of weekly testing at 32 weeks.
 We discussed the increased risk for fetal macrosomia,
 cardiac defects, maternal and fetal birth trauma with
 temporary and/or permenant damage, stillbirth and neonatal
 ICU admission.

 In addition we discussed starting daily low dose ASA for the
 prevention of preeclampsia.
Recommendations

 Serial growth exams every 4 weeks
 Fetal echocardiogram referral requested
 Initiate weekly testing at 32 weeks
 Low dose ASA

## 2021-12-25 ENCOUNTER — Ambulatory Visit: Payer: Medicaid Other

## 2022-01-01 ENCOUNTER — Ambulatory Visit: Payer: Medicaid Other

## 2022-01-15 ENCOUNTER — Encounter (HOSPITAL_COMMUNITY): Payer: Self-pay | Admitting: Obstetrics & Gynecology

## 2022-01-15 ENCOUNTER — Inpatient Hospital Stay (HOSPITAL_COMMUNITY)
Admission: AD | Admit: 2022-01-15 | Discharge: 2022-01-17 | DRG: 805 | Disposition: A | Discharge status: Home / Self Care | Payer: Medicaid Other | Attending: Obstetrics & Gynecology | Admitting: Obstetrics & Gynecology

## 2022-01-15 DIAGNOSIS — Z7984 Long term (current) use of oral hypoglycemic drugs: Secondary | ICD-10-CM | POA: Diagnosis not present

## 2022-01-15 DIAGNOSIS — Z803 Family history of malignant neoplasm of breast: Secondary | ICD-10-CM | POA: Diagnosis not present

## 2022-01-15 DIAGNOSIS — Z87891 Personal history of nicotine dependence: Secondary | ICD-10-CM | POA: Diagnosis not present

## 2022-01-15 DIAGNOSIS — Z833 Family history of diabetes mellitus: Secondary | ICD-10-CM

## 2022-01-15 DIAGNOSIS — Z7982 Long term (current) use of aspirin: Secondary | ICD-10-CM

## 2022-01-15 DIAGNOSIS — Z79899 Other long term (current) drug therapy: Secondary | ICD-10-CM

## 2022-01-15 DIAGNOSIS — O2412 Pre-existing diabetes mellitus, type 2, in childbirth: Secondary | ICD-10-CM | POA: Diagnosis present

## 2022-01-15 DIAGNOSIS — Z3A35 35 weeks gestation of pregnancy: Secondary | ICD-10-CM | POA: Diagnosis not present

## 2022-01-15 DIAGNOSIS — Z148 Genetic carrier of other disease: Secondary | ICD-10-CM

## 2022-01-15 DIAGNOSIS — Z20822 Contact with and (suspected) exposure to covid-19: Secondary | ICD-10-CM | POA: Diagnosis present

## 2022-01-15 DIAGNOSIS — O99214 Obesity complicating childbirth: Secondary | ICD-10-CM | POA: Diagnosis present

## 2022-01-15 DIAGNOSIS — O24119 Pre-existing diabetes mellitus, type 2, in pregnancy, unspecified trimester: Secondary | ICD-10-CM | POA: Diagnosis present

## 2022-01-15 DIAGNOSIS — Z880 Allergy status to penicillin: Secondary | ICD-10-CM | POA: Diagnosis not present

## 2022-01-15 HISTORY — DX: Anemia, unspecified: D64.9

## 2022-01-15 LAB — CBC
HCT: 42.4 % (ref 36.0–46.0)
Hemoglobin: 14.4 g/dL (ref 12.0–15.0)
MCH: 30 pg (ref 26.0–34.0)
MCHC: 34 g/dL (ref 30.0–36.0)
MCV: 88.3 fL (ref 80.0–100.0)
Platelets: 280 10*3/uL (ref 150–400)
RBC: 4.8 MIL/uL (ref 3.87–5.11)
RDW: 12.5 % (ref 11.5–15.5)
WBC: 10.4 10*3/uL (ref 4.0–10.5)
nRBC: 0 % (ref 0.0–0.2)

## 2022-01-15 LAB — RESP PANEL BY RT-PCR (FLU A&B, COVID) ARPGX2
Influenza A by PCR: NEGATIVE
Influenza B by PCR: NEGATIVE
SARS Coronavirus 2 by RT PCR: NEGATIVE

## 2022-01-15 LAB — TYPE AND SCREEN
ABO/RH(D): AB POS
Antibody Screen: NEGATIVE

## 2022-01-15 LAB — GLUCOSE, CAPILLARY: Glucose-Capillary: 186 mg/dL — ABNORMAL HIGH (ref 70–99)

## 2022-01-15 LAB — GROUP B STREP BY PCR: Group B strep by PCR: NEGATIVE

## 2022-01-15 MED ORDER — FLEET ENEMA 7-19 GM/118ML RE ENEM: 1.0000 | ENEMA | RECTAL | Status: DC | PRN

## 2022-01-15 MED ORDER — OXYCODONE HCL 5 MG PO TABS: 10.0000 mg | ORAL_TABLET | ORAL | Status: DC | PRN

## 2022-01-15 MED ORDER — METHYLERGONOVINE MALEATE 0.2 MG/ML IJ SOLN
0.2000 mg | Freq: Once | INTRAMUSCULAR | Status: AC
  Administered 2022-01-15: 0.2 mg via INTRAMUSCULAR

## 2022-01-15 MED ORDER — OXYCODONE-ACETAMINOPHEN 5-325 MG PO TABS
2.0000 | ORAL_TABLET | ORAL | Status: DC | PRN
  Administered 2022-01-15: 2 via ORAL
  Filled 2022-01-15: qty 2

## 2022-01-15 MED ORDER — TRANEXAMIC ACID-NACL 1000-0.7 MG/100ML-% IV SOLN
1000.0000 mg | INTRAVENOUS | Status: AC
  Administered 2022-01-15: 1000 mg via INTRAVENOUS

## 2022-01-15 MED ORDER — METFORMIN HCL 500 MG PO TABS
500.0000 mg | ORAL_TABLET | Freq: Two times a day (BID) | ORAL | Status: DC
Start: 2022-01-16 — End: 2022-01-17
  Administered 2022-01-15 – 2022-01-17 (×4): 500 mg via ORAL
  Filled 2022-01-15 (×4): qty 1

## 2022-01-15 MED ORDER — TETANUS-DIPHTH-ACELL PERTUSSIS 5-2.5-18.5 LF-MCG/0.5 IM SUSY
0.5000 mL | PREFILLED_SYRINGE | Freq: Once | INTRAMUSCULAR | Status: DC
Start: 2022-01-16 — End: 2022-01-17

## 2022-01-15 MED ORDER — OXYTOCIN-SODIUM CHLORIDE 30-0.9 UT/500ML-% IV SOLN
2.5000 [IU]/h | INTRAVENOUS | Status: DC
  Filled 2022-01-15: qty 500

## 2022-01-15 MED ORDER — IBUPROFEN 600 MG PO TABS
600.0000 mg | ORAL_TABLET | Freq: Four times a day (QID) | ORAL | Status: DC
  Administered 2022-01-15 – 2022-01-17 (×6): 600 mg via ORAL
  Filled 2022-01-15 (×7): qty 1

## 2022-01-15 MED ORDER — TERBUTALINE SULFATE 1 MG/ML IJ SOLN
INTRAMUSCULAR | Status: AC
  Administered 2022-01-15: 0.25 mg via SUBCUTANEOUS
  Filled 2022-01-15: qty 1

## 2022-01-15 MED ORDER — DIBUCAINE (PERIANAL) 1 % EX OINT: 1.0000 "application " | TOPICAL_OINTMENT | CUTANEOUS | Status: DC | PRN

## 2022-01-15 MED ORDER — TERBUTALINE SULFATE 1 MG/ML IJ SOLN: 0.2500 mg | Freq: Once | INTRAMUSCULAR | Status: AC

## 2022-01-15 MED ORDER — LACTATED RINGERS IV SOLN: INTRAVENOUS | Status: DC

## 2022-01-15 MED ORDER — BENZOCAINE-MENTHOL 20-0.5 % EX AERO
1.0000 "application " | INHALATION_SPRAY | CUTANEOUS | Status: DC | PRN
  Administered 2022-01-15: 1 via TOPICAL
  Filled 2022-01-15: qty 56

## 2022-01-15 MED ORDER — MISOPROSTOL 200 MCG PO TABS
ORAL_TABLET | ORAL | Status: AC
Start: 2022-01-15 — End: 2022-01-16
  Filled 2022-01-15: qty 4

## 2022-01-15 MED ORDER — TRANEXAMIC ACID-NACL 1000-0.7 MG/100ML-% IV SOLN
INTRAVENOUS | Status: AC
  Filled 2022-01-15: qty 100

## 2022-01-15 MED ORDER — SIMETHICONE 80 MG PO CHEW: 80.0000 mg | CHEWABLE_TABLET | ORAL | Status: DC | PRN

## 2022-01-15 MED ORDER — ONDANSETRON HCL 4 MG/2ML IJ SOLN: 4.0000 mg | INTRAMUSCULAR | Status: DC | PRN

## 2022-01-15 MED ORDER — COCONUT OIL OIL
1.0000 "application " | TOPICAL_OIL | Status: DC | PRN
  Administered 2022-01-16: 1 via TOPICAL

## 2022-01-15 MED ORDER — OXYCODONE-ACETAMINOPHEN 5-325 MG PO TABS: 1.0000 | ORAL_TABLET | ORAL | Status: DC | PRN

## 2022-01-15 MED ORDER — TERBUTALINE SULFATE 1 MG/ML IJ SOLN
INTRAMUSCULAR | Status: AC
  Filled 2022-01-15: qty 1

## 2022-01-15 MED ORDER — DIPHENHYDRAMINE HCL 25 MG PO CAPS: 25.0000 mg | ORAL_CAPSULE | Freq: Four times a day (QID) | ORAL | Status: DC | PRN

## 2022-01-15 MED ORDER — PRENATAL MULTIVITAMIN CH
1.0000 | ORAL_TABLET | Freq: Every day | ORAL | Status: DC
  Administered 2022-01-16: 1 via ORAL
  Filled 2022-01-15: qty 1

## 2022-01-15 MED ORDER — SOD CITRATE-CITRIC ACID 500-334 MG/5ML PO SOLN: 30.0000 mL | ORAL | Status: DC | PRN

## 2022-01-15 MED ORDER — WITCH HAZEL-GLYCERIN EX PADS: 1.0000 "application " | MEDICATED_PAD | CUTANEOUS | Status: DC | PRN

## 2022-01-15 MED ORDER — ONDANSETRON HCL 4 MG/2ML IJ SOLN: 4.0000 mg | Freq: Four times a day (QID) | INTRAMUSCULAR | Status: DC | PRN

## 2022-01-15 MED ORDER — OXYTOCIN BOLUS FROM INFUSION
333.0000 mL | Freq: Once | INTRAVENOUS | Status: AC
  Administered 2022-01-15: 333 mL via INTRAVENOUS

## 2022-01-15 MED ORDER — LACTATED RINGERS IV SOLN: 500.0000 mL | INTRAVENOUS | Status: DC | PRN

## 2022-01-15 MED ORDER — MISOPROSTOL 200 MCG PO TABS
1000.0000 ug | ORAL_TABLET | Freq: Once | ORAL | Status: AC
Start: 2022-01-15 — End: 2022-01-15

## 2022-01-15 MED ORDER — OXYCODONE HCL 5 MG PO TABS
5.0000 mg | ORAL_TABLET | ORAL | Status: DC | PRN
  Administered 2022-01-17: 5 mg via ORAL
  Filled 2022-01-15: qty 1

## 2022-01-15 MED ORDER — ONDANSETRON HCL 4 MG PO TABS: 4.0000 mg | ORAL_TABLET | ORAL | Status: DC | PRN

## 2022-01-15 MED ORDER — LACTATED RINGERS IV BOLUS
1000.0000 mL | Freq: Once | INTRAVENOUS | Status: AC
  Administered 2022-01-15: 1000 mL via INTRAVENOUS

## 2022-01-15 MED ORDER — METHYLERGONOVINE MALEATE 0.2 MG/ML IJ SOLN
INTRAMUSCULAR | Status: AC
  Filled 2022-01-15: qty 1

## 2022-01-15 MED ORDER — SENNOSIDES-DOCUSATE SODIUM 8.6-50 MG PO TABS
2.0000 | ORAL_TABLET | ORAL | Status: DC
  Administered 2022-01-15 – 2022-01-16 (×2): 2 via ORAL
  Filled 2022-01-15 (×2): qty 2

## 2022-01-15 MED ORDER — ACETAMINOPHEN 325 MG PO TABS: 650.0000 mg | ORAL_TABLET | ORAL | Status: DC | PRN

## 2022-01-15 MED ORDER — LIDOCAINE HCL (PF) 1 % IJ SOLN: 30.0000 mL | INTRAMUSCULAR | Status: DC | PRN

## 2022-01-15 MED ORDER — ACETAMINOPHEN 325 MG PO TABS
650.0000 mg | ORAL_TABLET | ORAL | Status: DC | PRN
Start: 2022-01-15 — End: 2022-01-17
  Administered 2022-01-16 (×2): 650 mg via ORAL
  Filled 2022-01-15 (×2): qty 2

## 2022-01-15 NOTE — MAU Provider Note (Addendum)
History     CSN: [id number]  Arrival date and time: 01/15/22 1734   Event Date/Time   First Provider Initiated Contact with Patient 01/15/22 1801      Chief Complaint  Patient presents with   Contractions   Ms. [name] [name] is a 31 y.o. year old [phone] female at [redacted]w[redacted]d weeks gestation who presents to MAU reporting she started having contractions at 1515 every 2 minutes and painful. She denies VB or leaking of fluid. She has a h/o PTD at 33 weeks in 2021. She receives [facility] with [facility]; next appt is Monday 01/20/2022.    OB History     Gravida  4   Para  2   Term  1   Preterm  1   AB  1   Living  2      SAB  1   IAB      Ectopic      Multiple  0   Live Births  2           Past Medical History:  Diagnosis Date   Anemia    Diabetes mellitus without complication (HCC)     Past Surgical History:  Procedure Laterality Date   NO PAST SURGERIES     TOOTH EXTRACTION Bilateral 03/13/2021   Procedure: EXTRACTION MOLARS;  Surgeon: Ocie Doyne, DMD;  Location: [facility];  Service: Oral Surgery;  Laterality: Bilateral;    Family History  Problem Relation Age of Onset   Diabetes Mother    Cancer Maternal Grandmother        Breast    Social History   Tobacco Use   Smoking status: Former    Types: Cigarettes    Quit date: 09/02/2017    Years since quitting: 4.3   Smokeless tobacco: Never  Vaping Use   Vaping Use: Never used  Substance Use Topics   Alcohol use: Not Currently    Comment: not while preg   Drug use: Not Currently    Types: Marijuana    Comment: last: "months ago"    Allergies:  Allergies  Allergen Reactions   Penicillins Rash    Medications Prior to Admission  Medication Sig Dispense Refill Last Dose   hydroxyprogesterone caproate (MAKENA) 250 mg/mL OIL injection Inject 250 mg into the muscle once.   Past Week   metFORMIN (GLUCOPHAGE) 500 MG tablet Take 500 mg by mouth 2 (two) times  daily with a meal.   01/15/2022   Prenatal Vit-Fe Fumarate-FA (MULTIVITAMIN-PRENATAL) 27-0.8 MG TABS tablet Take 1 tablet by mouth daily at 12 noon.   01/15/2022   aspirin 81 MG chewable tablet Chew by mouth daily.       Review of Systems  Constitutional: Negative.   HENT: Negative.    Eyes: Negative.   Respiratory: Negative.    Cardiovascular: Negative.   Gastrointestinal: Negative.   Endocrine: Negative.   Genitourinary:  Positive for pelvic pain (contractions every 2 minutes since 1515). Negative for vaginal bleeding and vaginal discharge.  Musculoskeletal: Negative.   Skin: Negative.   Allergic/Immunologic: Negative.   Neurological: Negative.   Hematological: Negative.   Psychiatric/Behavioral: Negative.    Physical Exam   Last menstrual period 05/12/2021, unknown if currently breastfeeding.  Physical Exam Vitals and nursing note reviewed. Exam conducted with a chaperone present.  Constitutional:      Appearance: Normal appearance. She is obese.  Cardiovascular:     Rate and Rhythm: Normal rate.  Genitourinary:  General: Normal vulva.     Comments: GBS obtained   Dilation: 5 Effacement (%): 90 Cervical Position: Middle Presentation: Vertex Exam by:: Carloyn Jaeger, CNM  Skin:    General: Skin is warm and dry.  Neurological:     Mental Status: She is alert and oriented to person, place, and time.  Psychiatric:        Mood and Affect: Mood normal.        Behavior: Behavior normal.        Thought Content: Thought content normal.        Judgment: Judgment normal.   NST - FHR: 150 bpm / moderate variability / accels absent / late decels present / TOCO: regular every 1-2 mins  Reassessment @ 1812: Patient PROM copious amounts of clear fluid with a strong urge to push. Cervical recheck Dilation: 6.5 Effacement (%): 90 Cervical Position: Middle Presentation: Vertex Exam by:: Carloyn Jaeger, CNM  MAU Course  Procedures  MDM GBS  COVID swab Laboring patient with BBOW @ 35  weeks with a h/o PTD at 33 weeks in 2021 Assessment and Plan  1. Preterm labor in third trimester without delivery 2. [redacted] weeks gestation of pregnancy - Call [facility] provider on-call to get admission orders to L&D - [facility] to assume care of patient at [address], CNM 01/15/2022, 6:02 PM

## 2022-01-15 NOTE — H&P (Signed)
OB ADMISSION/ HISTORY & PHYSICAL:  Admission Date: 01/15/2022  5:34 PM  Admit Diagnosis: Preterm labor  [name] [name] is a 31 y.o. female [phone] [redacted]w[redacted]d presenting for labor eval. Endorses active FM, denies LOF and vaginal bleeding. Hx of preterm delivery @ 33 wks, on Makena this pregnancy. This pregnancy complicated by [id number] managed on Metformin. Ctx began @ 1500. SROM occurred en route to labor room and pt reports an urge to push. CNM present and pt found to be 9 cm and +1 station. EFW >98%ile at 31 weeks.  History of current pregnancy: [id number]   Patient entered care with [facility] at 6+4 wks.   EDC 02/16/22 by [place] @ 6+3 wks   Anatomy scan:  20 wks limited, complete anat at 23+4 w/ posterior placenta.   Antenatal testing: for A2DM and maternal obesity started at 28 wks Last evaluation: 35+1 wks vertex/ posterior placenta/ EFW 7# 10 oz (>89%)   Significant prenatal events:  Patient Active Problem List   Diagnosis Date Noted   Preterm labor 01/15/2022   Carrier of spinal muscular atrophy 01/15/2022   Pre-existing type 2 diabetes mellitus during pregnancy, antepartum 05/31/2020    Prenatal Labs: ABO, Rh: --/--/AB POS (02/22 1759) Antibody: NEG (02/22 1759) Rubella:   immune RPR:   NR HBsAg:   NR HIV:   NR GTT: A2DM GBS:   unknown GC/CHL: neg/neg Genetics: SMA carrier, low-risk female Tdap/influenza vaccines: declined both   OB History  Gravida Para Term Preterm AB Living  4 2 1 1 1 2   SAB IAB Ectopic Multiple Live Births  1     0 2    # Outcome Date GA Lbr Len/2nd Weight Sex Delivery Anes PTL Lv  4 Current           3 Preterm 09/12/20 [redacted]w[redacted]d 05:05 / 00:09 2390 g F Vag-Spont EPI  LIV  2 Term 08/01/09    F Vag-Spont   LIV  1 SAB  [redacted]w[redacted]d           Medical / Surgical History: Past medical history:  Past Medical History:  Diagnosis Date   Anemia    Diabetes mellitus without complication ([place])     Past surgical history:  Past Surgical History:  Procedure Laterality Date    NO PAST SURGERIES     TOOTH EXTRACTION Bilateral 03/13/2021   Procedure: EXTRACTION MOLARS;  Surgeon: Diona Browner, DMD;  Location: [facility];  Service: Oral Surgery;  Laterality: Bilateral;   Family History:  Family History  Problem Relation Age of Onset   Diabetes Mother    Cancer Maternal Grandmother        Breast    Social History:  reports that she quit smoking about 4 years ago. Her smoking use included cigarettes. She has never used smokeless tobacco. She reports that she does not currently use alcohol. She reports that she does not currently use drugs after having used the following drugs: Marijuana.  Allergies: Penicillins   Current Medications at time of admission:  Prior to Admission medications   Medication Sig Start Date End Date Taking? Authorizing Provider  hydroxyprogesterone caproate (MAKENA) 250 mg/mL OIL injection Inject 250 mg into the muscle once.   Yes [provider]  metFORMIN (GLUCOPHAGE) 500 MG tablet Take 500 mg by mouth 2 (two) times daily with a meal.   Yes [provider]  Prenatal Vit-Fe Fumarate-FA (MULTIVITAMIN-PRENATAL) 27-0.8 MG TABS tablet Take 1 tablet by mouth daily at 12 noon.   Yes  [provider]  aspirin 81 MG chewable tablet Chew by mouth daily.    [provider]    Review of Systems: Constitutional: Negative   HENT: Negative   Eyes: Negative   Respiratory: Negative   Cardiovascular: Negative   Gastrointestinal: Negative  Genitourinary: pos for bloody show, pos for LOF   Musculoskeletal: Negative   Skin: Negative   Neurological: Negative   Endo/Heme/Allergies: Negative   Psychiatric/Behavioral: Negative    Physical Exam: VS: Blood pressure 139/69, pulse 91, resp. rate 18, last menstrual period 05/12/2021, unknown if currently breastfeeding. AAO x3, no signs of distress Cardiovascular: normal cap refill Respiratory: unlabored GU/GI: Abdomen gravid, non-tender, non-distended,  active FM, vertex Extremities: no edema, negative for pain, tenderness, and cords  Cervical exam:Dilation: 10 Effacement (%): 90 Exam by:: Zohair Epp, cnm FHR: baseline rate 130 / variability moderate / accelerations absent / early and late decelerations TOCO: 2-3   Prenatal Transfer Tool  Maternal Diabetes: Yes:  Diabetes Type:  Pre-pregnancy, Insulin/Medication controlled Metformin Genetic Screening: Normal, SMA carrier Maternal Ultrasounds/Referrals: Other: Suspected macrosomia, EFW >98%ile Fetal Ultrasounds or other Referrals:  Referred to Materal Fetal Medicine  Maternal Substance Abuse:  No Significant Maternal Medications:  Meds include: Other: Metformin and Makena Significant Maternal Lab Results: None    Assessment: 31 y.o. [id number] [redacted]w[redacted]d Preterm labor A2DM managed with Metformin  Active stage of labor FHR category 2 GBS unknown, rapid test collected in MAU Pain management plan: unmedicated due to precipitous labor and pending birth   Plan:  Admit to L&D Routine admission orders Delivery imminent  Dr Charlesetta Garibaldi briefly notified of imminent birth via text.   Arrie Eastern MSN, CNM 01/15/2022 7:10 PM

## 2022-01-15 NOTE — MAU Note (Signed)
First pain at 1515, now coming every couple min. No bleeding or leaking.  Hx of Preterm delivery.

## 2022-01-16 ENCOUNTER — Other Ambulatory Visit: Payer: Self-pay

## 2022-01-16 LAB — CBC
HCT: 36.9 % (ref 36.0–46.0)
Hemoglobin: 12.5 g/dL (ref 12.0–15.0)
MCH: 29.9 pg (ref 26.0–34.0)
MCHC: 33.9 g/dL (ref 30.0–36.0)
MCV: 88.3 fL (ref 80.0–100.0)
Platelets: 199 10*3/uL (ref 150–400)
RBC: 4.18 MIL/uL (ref 3.87–5.11)
RDW: 12.6 % (ref 11.5–15.5)
WBC: 12.2 10*3/uL — ABNORMAL HIGH (ref 4.0–10.5)
nRBC: 0 % (ref 0.0–0.2)

## 2022-01-16 LAB — GLUCOSE, CAPILLARY
Glucose-Capillary: 94 mg/dL (ref 70–99)
Glucose-Capillary: 121 mg/dL — ABNORMAL HIGH (ref 70–99)
Glucose-Capillary: 113 mg/dL — ABNORMAL HIGH (ref 70–99)

## 2022-01-16 LAB — RPR: RPR Ser Ql: NONREACTIVE

## 2022-01-16 NOTE — Lactation Note (Signed)
This note was copied from a baby's chart.  NICU Lactation Consultation Note  Patient Name: [name] [name] BMWUX'L Date: 01/16/2022 Age:30 hours  Lactation reviewed pumping strategies and set up hands free top. I reviewed her history; Ms. Overley had a previous child in the NICU in recent history (2021) and verbalized understanding of the purpose of early and frequent pumping. Her insurance ([employer]) may not cover a breast pump, but I indicated we would send in a [facility] referral just in case. I will also send in a referral to [facility] for a loaner pump; she is familiar with this and used a WIC pump with her last child. Lactation to follow up tomorrow with more information regarding personal pump.   Her newborn's name is [name] [name].  Subjective Reason for consult: Initial assessment; NICU baby; Late-preterm 34-36.6wks   Objective Infant data: Mother's Current Feeding Choice: Breast Milk and Donor Milk  Infant feeding assessment No data recorded    Maternal data: [id number]  Vaginal, Spontaneous No data recorded Current breast feeding challenges:: NICU  Previous breastfeeding challenges?: Exclusive pump and bottle fed  Does the patient have breastfeeding experience prior to this delivery?: Yes How long did the patient breastfeed?: Pumped 10 months  Pumping frequency: did not pump overnight due to fatigue; recommended q3 hours or 8+ times/day Pumped volume: 0 mL Flange Size: 27  Risk factor for low milk supply:: DMII possible risk factor; history of adequate milk supply, however.   WIC Program: Yes [facility] Referral Sent?:  (sending it today (2/23)) Pump:  (Interested in purchasing a hands fee pump)  Assessment Infant: No data recorded No data recorded  Maternal: Milk volume: Normal   Intervention/Plan Interventions: Breast feeding basics reviewed; Education  Tools: Pump; Flanges; Hands-free pumping top Pump Education: Setup, frequency, and cleaning (requested  labels)  Plan: Consult Status: Follow-up  NICU Follow-up type: New admission follow up; Verify onset of copious milk; Verify absence of engorgement    Walker Shadow 01/16/2022, 10:41 AM

## 2022-01-16 NOTE — Progress Notes (Signed)
MD PROGRESS NOTE  [name] [name] [id number]  PPD# 1 SVD with 4 min shoulder with Maureen Ralphs   Subjective:   Patient doing well. She has mild uterine cramping.  She is tolerating PO fluid and solids w/o nausea or vomiting Bleeding is light Up ad lib / ambulatory / voiding w/o difficulty Patient is pumping breast milk   Objective:   VS: BP 110/66 (BP Location: Left Arm)    Pulse 69    Temp 98.2 F (36.8 C) (Oral)    Resp 18    LMP 05/12/2021    SpO2 98%   Physical Exam: Alert and oriented X3 Lungs: Clear and unlabored Heart: regular rate and rhythm / no mumurs Abdomen: soft, non-tender, non-distended  Fundus: firm, non-tender, U-1 Perineum: not evaluated Lochia: minimal Extremities: 1+ edema, no calf pain, tenderness, or cords  Labs:  Recent Labs    01/15/22 1808 01/16/22 0547  WBC 10.4 12.2*  HGB 14.4 12.5  PLT 280 199   Blood type: --/--/AB POS (02/22 1759) Rubella:                       Assessment:  PPD # 1 s/p NSVD doing well  Baby intubated in NICU  Plan:  Continue routine post partum care Anticipate D/C tomorrow    Essie Hart, MD 01/16/2022, 9:51 AM

## 2022-01-17 ENCOUNTER — Ambulatory Visit: Payer: Self-pay

## 2022-01-17 LAB — HEMOGLOBIN A1C
Hgb A1c MFr Bld: 7.1 % — ABNORMAL HIGH (ref 4.8–5.6)
Mean Plasma Glucose: 157.07 mg/dL

## 2022-01-17 LAB — GLUCOSE, CAPILLARY: Glucose-Capillary: 113 mg/dL — ABNORMAL HIGH (ref 70–99)

## 2022-01-17 LAB — SURGICAL PATHOLOGY

## 2022-01-17 MED ORDER — METHOCARBAMOL 500 MG PO TABS
500.0000 mg | ORAL_TABLET | Freq: Four times a day (QID) | ORAL | 4 refills | Status: DC | PRN
Start: 2022-01-17 — End: 2023-04-17

## 2022-01-17 MED ORDER — ACETAMINOPHEN 325 MG PO TABS
650.0000 mg | ORAL_TABLET | ORAL | 3 refills | Status: DC | PRN
Start: 2022-01-17 — End: 2023-04-17

## 2022-01-17 MED ORDER — IBUPROFEN 600 MG PO TABS: 600.0000 mg | ORAL_TABLET | Freq: Four times a day (QID) | ORAL | 3 refills | Status: DC | PRN

## 2022-01-17 NOTE — Progress Notes (Signed)
Patient screened out for psychosocial assessment since none of the following apply: °Psychosocial stressors documented in mother or baby's chart °Gestation less than 32 weeks °Code at delivery  °Infant with anomalies °Please contact the Clinical Social Worker if specific needs arise, by MOB's request, or if MOB scores greater than 9/yes to question 10 on Edinburgh Postpartum Depression Screen. ° °Elica Almas Boyd-Gilyard, MSW, LCSW °Clinical Social Work °[phone] °  °

## 2022-01-17 NOTE — Progress Notes (Signed)
Pt discharged after discharge instructions given. All questions answered. Pt discharged with all belongings.

## 2022-01-17 NOTE — Discharge Summary (Signed)
[facility] Discharge Summary   Patient Name:   [name] [name] DOB:     1991/07/23 MRN:     [id number]  Date of Admission:   01/15/2022 Date of Discharge:  01/17/2022  Admitting diagnosis:   Preterm labor [O60.00]   Pre-existing type 2 diabetes mellitus during pregnancy, antepartum   Carrier of spinal muscular atrophy  Type 2 DM    Discharge diagnosis:    Preterm labor [O60.00] Principal Problem:   Shoulder dystocia during labor and delivery, delivered Active Problems:   Pre-existing type 2 diabetes mellitus during pregnancy, antepartum   Preterm labor   Carrier of spinal muscular atrophy   PPH (postpartum hemorrhage)  Preterm Pregnancy Delivered        Additional problems: None                                          Post partum procedures: None Augmentation: N/A Complications: Cord avulsion at delivery, shoulder dystocia 4 min, pph 700 EBL  Hospital course: Onset of Labor With Vaginal Delivery      31 y.o. yo [id number] at 59w5dwas admitted in Active Labor on 01/15/2022. Patient had an uncomplicated labor course as follows:  Membrane Rupture Time/Date: 6:11 PM ,01/15/2022   Delivery Method:Vaginal, Spontaneous  4 minute shoulder dystocia and umbilical cord avulsion. Peds present at delivery.  Episiotomy: None  Lacerations:  1st degree  Patient had an uncomplicated postpartum course.  Postpartum Hb 12 from 14. She is ambulating, tolerating a regular diet, passing flatus, and urinating well. Patient is discharged home in stable condition on 01/17/22.  Newborn Data: Birth date:01/15/2022  Birth time:6:43 PM  Gender:Female  Living status:Living  Apgars:1 ,5  Weight:3650 g   Magnesium Sulfate received: No BMZ received: No Rhophylac:N/A MMR:No T-DaP: Declined Flu: No Transfusion:No                                                               Type of Delivery:  SVD Delivering Provider: AOsborne Oman Date of Delivery:  01/16/22  Newborn  Data:  Baby Feeding:   NG tube currently NPO Disposition:   NICU  Physical Exam:   Vitals:   01/16/22 1602 01/16/22 1924 01/17/22 0427 01/17/22 0807  BP: 104/63 111/70 (!) 99/57 107/65  Pulse: 62 67 (!) 56 66  Resp: 18 18 18 18   Temp: 98.2 F (36.8 C) 98.2 F (36.8 C) 98.1 F (36.7 C) 97.6 F (36.4 C)  TempSrc: Oral Oral Oral Oral  SpO2: 99%  99% 95%  Weight:      Height:       General: alert, cooperative, and no distress Lochia: appropriate Uterine Fundus: firm Incision: N/A DVT Evaluation: No evidence of DVT seen on physical exam. Negative Homan's sign. No cords or calf tenderness.  Labs: Lab Results  Component Value Date   WBC 12.2 (H) 01/16/2022   HGB 12.5 01/16/2022   HCT 36.9 01/16/2022   MCV 88.3 01/16/2022   PLT 199 01/16/2022   CMP Latest Ref Rng & Units 07/03/2020  Glucose 70 - 99 mg/dL 187(H)  BUN 6 - 20 mg/dL 13  Creatinine 0.44 - 1.00 mg/dL 0.65  Sodium 135 - 145 mmol/L 136  Potassium 3.5 - 5.1 mmol/L 3.8  Chloride 98 - 111 mmol/L 102  CO2 22 - 32 mmol/L 23  Calcium 8.9 - 10.3 mg/dL 9.7  Total Protein 6.5 - 8.1 g/dL 7.5  Total Bilirubin 0.3 - 1.2 mg/dL 1.1  Alkaline Phos 38 - 126 U/L 53  AST 15 - 41 U/L 14(L)  ALT 0 - 44 U/L 22    Discharge instruction: per After Visit Summary and "Baby and Me Booklet".  After Visit Meds:  Allergies as of 01/17/2022       Reactions   Penicillins Rash        Medication List     STOP taking these medications    aspirin 81 MG chewable tablet   hydroxyprogesterone caproate 250 mg/mL Oil injection Commonly known as: MAKENA       TAKE these medications    acetaminophen 325 MG tablet Commonly known as: Tylenol Take 2 tablets (650 mg total) by mouth every 4 (four) hours as needed for moderate pain or mild pain (for pain scale < 4).   ibuprofen 600 MG tablet Commonly known as: ADVIL Take 1 tablet (600 mg total) by mouth every 6 (six) hours as needed for moderate pain or cramping.   metFORMIN  500 MG tablet Commonly known as: GLUCOPHAGE Take 500 mg by mouth 2 (two) times daily with a meal.   methocarbamol 500 MG tablet Commonly known as: Robaxin Take 1 tablet (500 mg total) by mouth every 6 (six) hours as needed (muscle cramping).   multivitamin-prenatal 27-0.8 MG Tabs tablet Take 1 tablet by mouth daily at 12 noon.               Durable Medical Equipment  (From admission, onward)           Start     Ordered   01/16/22 1109  For home use only DME double electric breast pump  Once       Comments: ICD-10 code: Z39.1   01/16/22 1109            Diet: carb modified diet  Activity: Advance as tolerated. Pelvic rest for 6 weeks.   Outpatient follow up:6 weeks Follow up Appt:No future appointments. Follow up visit: No follow-ups on file.  Postpartum contraception: Nexplanon  01/17/2022 Sanjuana Kava, MD

## 2022-01-17 NOTE — Lactation Note (Signed)
This note was copied from a baby's chart. Lactation Consultation Note  Patient Name: [name] [name] [id number] Date: 01/17/2022   Age:30 hours  Lactation called [name] [name] to check in regarding her status for a breast pump. She was discharged from [facility] specialty today.   I placed a referral to [facility] on 2/23, but she did not hear from them today. We discussed her options. She plans to use a hand pump overnight. I recommended pumping each breast for 10 minutes q3 hours tonight.  She will be back in around 0830-0900 and is interested in obtaining a [facility] loaner pump. I notified her of the $30 cash (refundable) deposit. I set up a follow up appointment for 2/25.   [name] [name] states that she may purchase a wearable pump as well.     Lenore Manner 01/17/2022, 5:02 PM

## 2022-01-18 ENCOUNTER — Ambulatory Visit: Payer: Self-pay

## 2022-01-18 NOTE — Lactation Note (Signed)
This note was copied from a baby's chart.  NICU Lactation Consultation Note  Patient Name: [name] [name] LOVFI'E Date: 01/18/2022 Age:30 hours   Subjective Reason for consult: Follow-up assessment Mother continues to pump often. She denies s/s of engorgement. Mother purchased a pump for at-home use.   Objective Infant data: Mother's Current Feeding Choice: Breast Milk  Infant feeding assessment Scale for Readiness: 5    Maternal data: [id number]  Vaginal, Spontaneous  Pumping frequency: q3 Pumped volume: 25 mL   WIC Program: Yes WIC Referral Sent?:  (sending it today (2/23)) Pump: Personal (Lansinoh)  Assessment Infant: Feeding Status: NPO   Maternal: Milk volume: Normal   Intervention/Plan Interventions: Education  Plan: Consult Status: Follow-up  NICU Follow-up type: Verify absence of engorgement; Weekly NICU follow up  Mother to continue pumping q3  [name] 01/18/2022, 11:42 AM

## 2022-01-20 ENCOUNTER — Ambulatory Visit: Payer: Self-pay

## 2022-01-20 NOTE — Lactation Note (Signed)
This note was copied from a baby's chart.  NICU Lactation Consultation Note  Patient Name: [name] [name] Date: 01/20/2022 Age:30 days   Subjective Reason for consult: Late-preterm 34-36.6wks; Follow-up assessment; NICU baby  Visited with Dad "Sheria Lang", Dad mentioned Mom would arrive around 3 pm today. Dad confirmed that Mom is pumping every 2-3 hours and has 6-7 bottles she will bring with expressed milk, some bottles are about half full. Dad confirmed that they bought a personal pump the day baby was born. LC Student asked if Dad would let Mom know that lactation stopped by to follow up with her and to have Mom contact lactation when she arrives.   Objective Infant data: Mother's Current Feeding Choice: Breast Milk  Infant feeding assessment Scale for Readiness: 5    Maternal data: [id number]  Vaginal, Spontaneous  WIC Program: Yes WIC Referral Sent?:  (sending it today (2/23)) Pump: Personal (Lansinoh)  Assessment Infant: Feeding Status: NPO   Maternal:   Intervention/Plan Plan:  1) Dad "Sheria Lang" said he would let Mom know to contact lactation when she arrives.   Consult Status: Follow-up  NICU Follow-up type: Weekly NICU follow up; Verify absence of engorgement    Melody Comas 01/20/2022, 10:04 AM

## 2022-01-21 ENCOUNTER — Ambulatory Visit: Payer: Self-pay

## 2022-01-21 NOTE — Lactation Note (Addendum)
This note was copied from a baby's chart. Lactation Consultation Note  Patient Name: [name] [name] Date: 01/21/2022 Reason for consult: NICU baby;Follow-up assessment;Mother's request;Late-preterm 34-36.6wks;Maternal endocrine disorder Age:31  9:00 AM This LC and LC student Josie saw red light on phone, this mom had called the NICU [facility] phone line at 1:31 pm on 01/20/2022 and left a message asking for a return call but no one called her back. Returned mom's phone call first thing in the morning and she voiced she had some concerns about her milk and breastmilk storage guidelines. She said she was planning on coming to the unit and would like to see lactation when she arrived.   6:55 PM NICU RN Allyn Kenner called this LC to inform of mom's arrival.  Visited with mom of 27 49/34 weeks old (adjusted) NICU female, she's a P3 and had a question regarding breastmilk storage guidelines. Mom had brought 3 days worth of breastmilk and wanted to know when she should start freezing the bottles. Reviewed storage guidelines, mom's plan is to have baby on an exclusive breastmilk diet just like she did with her last child (former NICU baby as well, she's 3 y.o now) she plans to put baby to breast but she also plans on offering a bottle when she's not here.  Mom still pumping on initiation settings, but her milk already came in on 01/18/22; reviewed expression settings and ways to protect her supply.  Maternal Data  Mom's supply is WNL  Feeding Mother's Current Feeding Choice: Breast Milk  Lactation Tools Discussed/Used Tools: Pump;Flanges Flange Size: 27 Breast pump type: Double-Electric Breast Pump Pump Education: Setup, frequency, and cleaning;Milk Storage Reason for Pumping: LPI in NICU Pumping frequency: 6 times/24 hours Pumped volume: 120 mL  Interventions Interventions: DEBP;Education  Plan of care Encouraged mom to continue pumping consistently at least 8 times/24 hours She'll  start using the expression setting on her pump  No other support person at this time. All questions and concerns answered, mom to call NICU LC PRN.  Discharge Pump: DEBP;Personal (Lansinoh DEBP per previous LC)  Consult Status Consult Status: Follow-up Date: 01/21/22 Follow-up type: In-patient   Jarissa Sheriff Venetia Constable 01/21/2022, 7:21 PM

## 2022-01-25 ENCOUNTER — Telehealth (HOSPITAL_COMMUNITY): Payer: Self-pay | Admitting: *Deleted

## 2022-01-25 NOTE — Telephone Encounter (Signed)
Patient voiced no questions or concerns at this time. EPDS=4. Infant remains in NICU. Patient verbalized understanding. Patient requested RN email information on hospital's virtual postpartum classes and support groups. Email sent. Erline Levine, RN, 01/25/22, 1145   ?

## 2022-02-02 ENCOUNTER — Ambulatory Visit: Payer: Self-pay

## 2022-02-02 NOTE — Lactation Note (Signed)
This note was copied from a baby's chart. ? ?NICU Lactation Consultation Note ? ?Patient Name: [name] [name] ?Today's Date: 02/02/2022 ?Age:30 wk.o. ? ? ?Subjective ?Reason for consult: Follow-up assessment ?Mother continues to pump often and without difficulty. She would like to begin lick&learn breastfeeding trial with infant.  ? ?Objective ?Infant data: ?Mother's Current Feeding Choice: Breast Milk ? ?Infant feeding assessment ?Scale for Readiness: 1 ? ?  ?Maternal data: ?[id number]  ?Vaginal, Spontaneous ?Pumping frequency: 6-8x day ?Pumped volume: 210 mL ? ?WIC Program: Yes ?WIC Referral Sent?:  (sending it today (2/23)) ?Pump: DEBP, Personal (Lansinoh DEBP per previous LC) ? ?Assessment ?Maternal: ?Milk volume: Abundant ? ? ?Intervention/Plan ?Interventions: Education; Infant Driven Feeding Algorithm education ? ?No data recorded ?Plan: ?Consult Status: Follow-up ? ?NICU Follow-up type: Assist with IDF-1 (Mother to pre-pump before breastfeeding) ? ?LC to assist with lick and learn bf on Monday at 1200 feeding. ? ?[name] ?02/02/2022, 5:55 PM ?

## 2022-02-03 ENCOUNTER — Ambulatory Visit: Payer: Self-pay

## 2022-02-03 NOTE — Lactation Note (Signed)
This note was copied from a baby's chart. ? ?NICU Lactation Consultation Note ? ?Patient Name: [name] [name] ?Today's Date: 02/03/2022 ?Age:29 wk.o. ? ? ?Subjective ?Reason for consult: Follow-up assessment; Mother's request; NICU baby; Late-preterm 34-36.6wks ? ?Lactation followed up with [name] [name] to assist with initiation of breast feeding. We placed baby [name] [name] in cradle hold on the left side allowing her left arm to stay above her body. Baby was cueing at this time. ? ?Baby latched readily with no hesitation. She released the breast once. I showed [name] [name] how to compress her breast tissue to provide additional support to baby at the breast, and then I slowly released that support. [name] [name] maintained her latch and intermittently suckled.  ? ?[name] [name], [name] [name], was also present. We provided education on the goals for initial breast feeding, differences between nutritive and non-nutritive patterns, and skills development. ? ?[name] [name] would like to breast feed [name] [name] when she is visiting; however, when she is not visiting, she would like other care-givers to practice bottle feeding with [name] [name].  ? ?[name] [name] would like lactation to follow up this week. She stated that she would call for lactation support when she is ready. ? ?Objective ?Infant data: ?Mother's Current Feeding Choice: Breast Milk ? ?Infant feeding assessment ?Scale for Readiness: 2 ? ?Maternal data: ?[id number]  ?Vaginal, Spontaneous ? ?Current breast feeding challenges:: NICU ? ?Previous breastfeeding challenges?: Exclusive pump and bottle fed ? ?Does the patient have breastfeeding experience prior to this delivery?: Yes ? ?Pumping frequency: 6-8 times/day ?Pumped volume: 210 mL ? ? ?WIC Program: Yes ?WIC Referral Sent?:  (sending it today (2/23)) ?Pump: Personal ? ?Assessment ?Infant: ?LATCH Score: 6 ? ?Maternal: ?Milk volume: Abundant ? ?Intervention/Plan ?Interventions: Breast feeding basics reviewed; Assisted with latch; Skin to skin; Hand express;  Breast compression; Adjust position; Education ? ?No data recorded ?Plan: ?Consult Status: Follow-up ? ?NICU Follow-up type: Assist with IDF-1 (Mother to pre-pump before breastfeeding); Weekly NICU follow up ? ? ?Walker Shadow ?02/03/2022, 12:27 PM ?

## 2022-02-06 IMAGING — US US MFM OB FOLLOW-UP
1 series · 13 of 28 positions shown · non-contrast
Comparison: none

[Series 1: us mfm ob follow-up · 73 acquisitions, 13 frames shown]
[im 3/73]
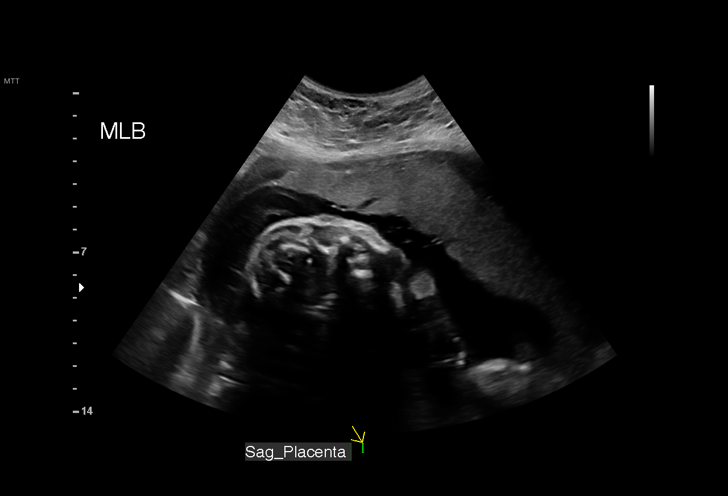
[im 9/73]
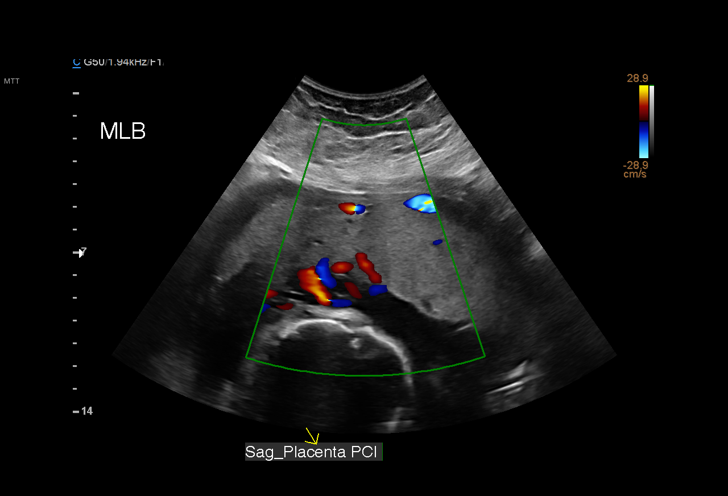
[im 14/73]
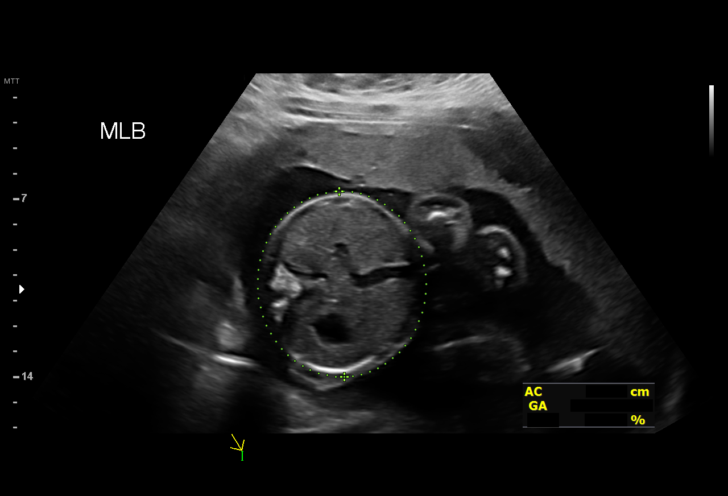
[im 19/73]
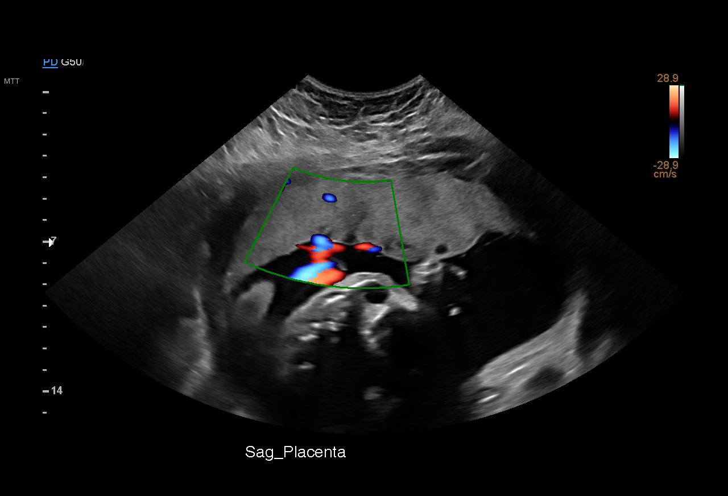
[im 25/73]
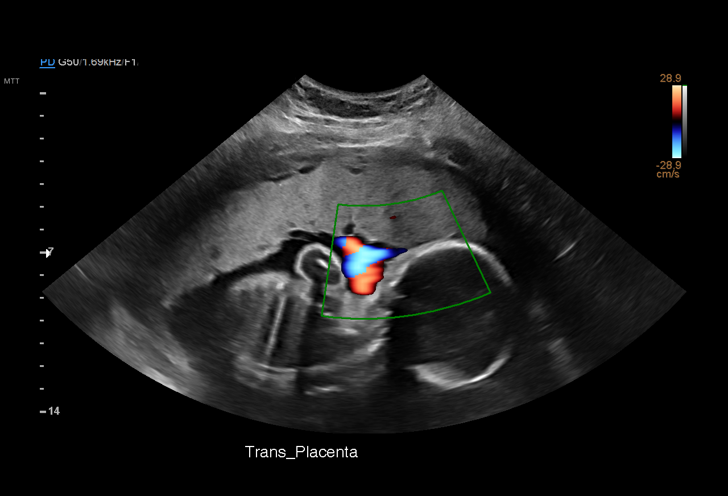
[im 30/73]
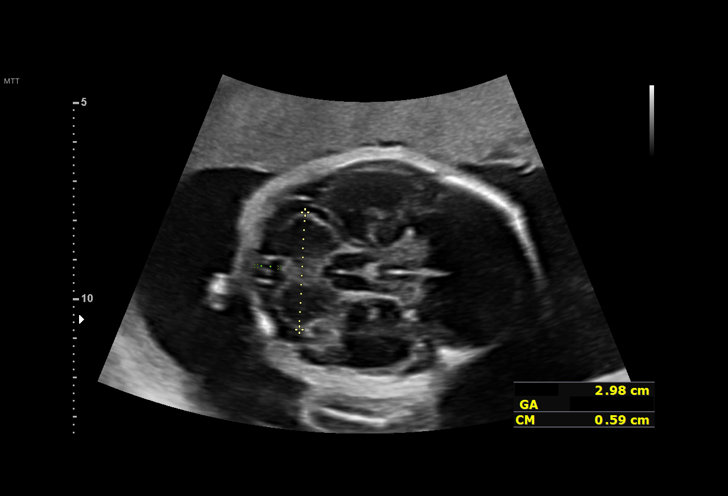
[im 38/73]
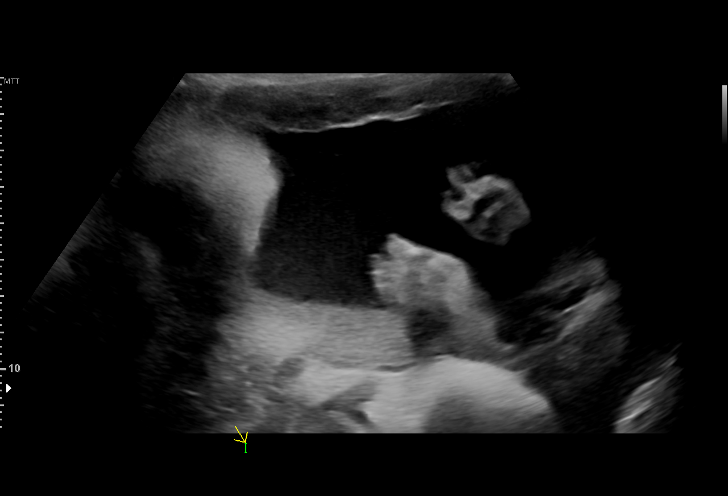
[im 43/73]
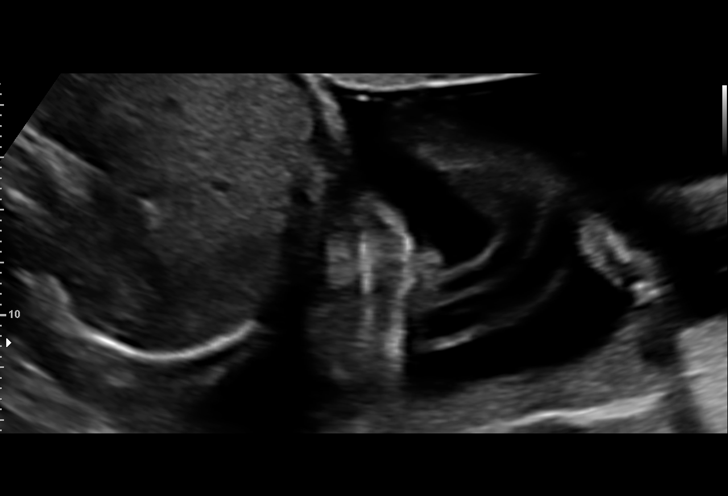
[im 49/73]
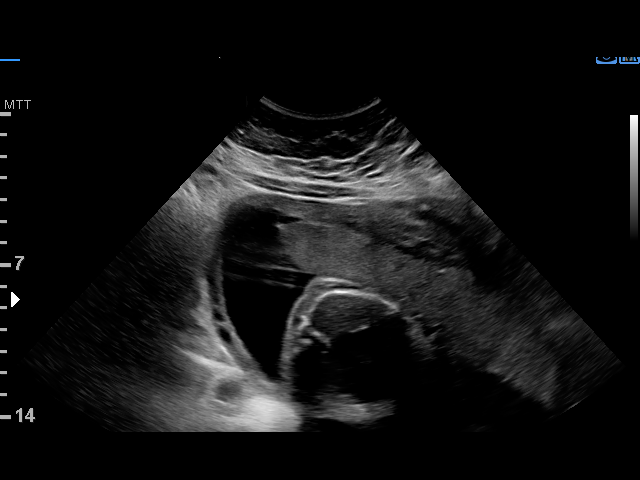
[im 54/73]
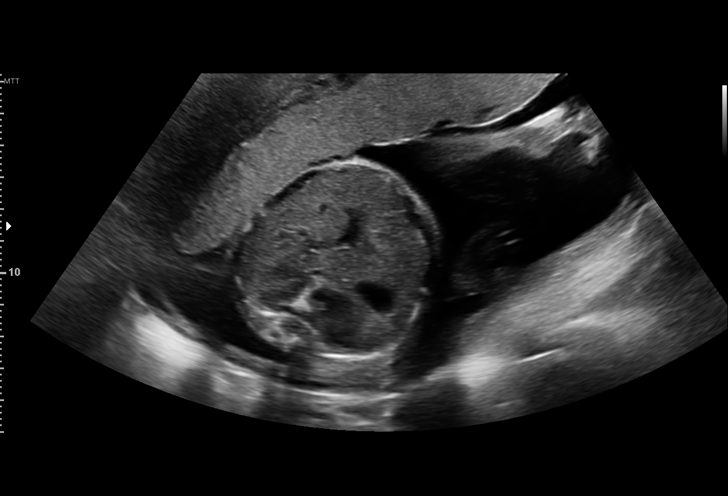
[im 59/73]
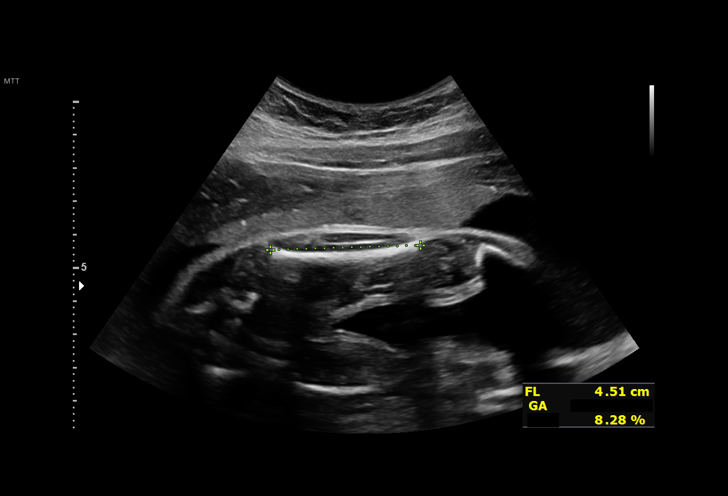
[im 65/73]
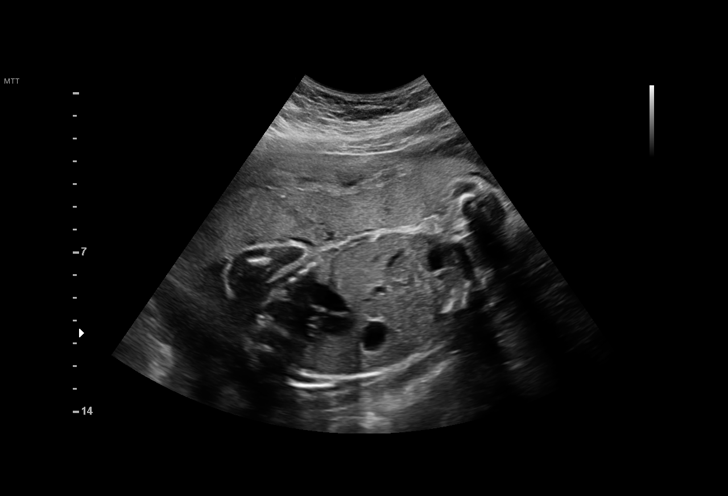
[im 70/73]
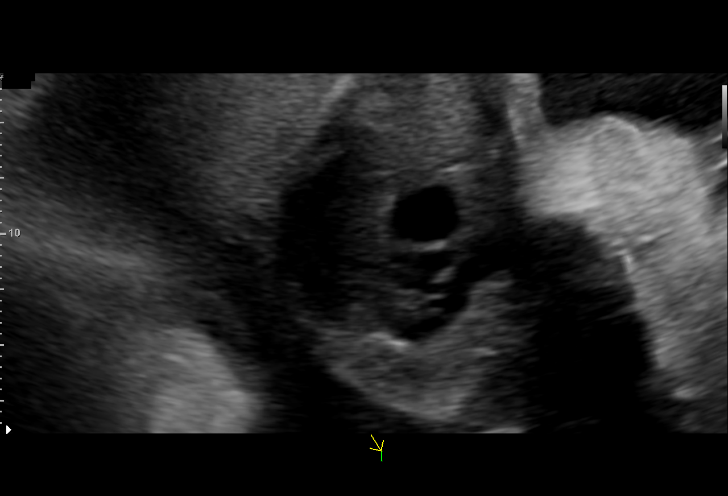

[13 of 28 positions shown; findings below may reference images not displayed]

Obstetrics &
                                                            Gynecology
                                                            [address]
                                                            [place].

Indications

 26 weeks gestation of pregnancy
 Pre-existing diabetes, type 2, in pregnancy,
 second trimester
 Abnormal chromosomal and genetic finding
 on antenatal screening of mother (NIPS, low
 FF, SMA Carrier)
 Obesity complicating pregnancy, second
 trimester
 Tobacco use complicating pregnancy,
 second trimester
 Encounter for other antenatal screening
 follow-up
Vital Signs

                                                Height:        5'6"
Fetal Evaluation

 Num Of Fetuses:         1
 Fetal Heart Rate(bpm):  135
 Cardiac Activity:       Observed
 Presentation:           Transverse, head to maternal left
 Placenta:               Anterior
 P. Cord Insertion:      Previously Visualized
 Amniotic Fluid
 AFI FV:      Within normal limits

                             Largest Pocket(cm)

Biometry

 BPD:      59.5  mm     G. Age:  24w 2d        2.3  %    CI:        75.34   %    70 - 86
                                                         FL/HC:      20.7   %    18.6 -
 HC:      217.4  mm     G. Age:  23w 5d        < 1  %    HC/AC:      1.00        1.04 -
 AC:      218.3  mm     G. Age:  26w 2d         46  %    FL/BPD:     75.6   %    71 - 87
 FL:         45  mm     G. Age:  24w 6d          8  %    FL/AC:      20.6   %    20 - 24
 CER:      29.8  mm     G. Age:  26w 0d         47  %

 LV:        5.5  mm
 CM:        5.9  mm

 Est. FW:     808  gm    1 lb 13 oz      15  %
OB History

 Gravidity:    2         Term:   1
 Living:       1
Gestational Age

 U/S Today:     24w 6d                                        EDD:   11/08/20
 Best:          26w 1d     Det. By:  Previous Ultrasound      EDD:   10/30/20
                                     (03/20/20)
Anatomy

 Cranium:               Previously seen        Aortic Arch:            Previously seen
 Cavum:                 Appears normal         Ductal Arch:            Previously seen
 Ventricles:            Appears normal         Diaphragm:              Appears normal
 Choroid Plexus:        Previously seen        Stomach:                Appears normal, left
                                                                       sided
 Cerebellum:            Appears normal         Abdomen:                Previously seen
 Posterior Fossa:       Previously seen        Abdominal Wall:         Previously seen
 Nuchal Fold:           Previously seen        Cord Vessels:           Previously seen
 Face:                  Orbits and profile     Kidneys:                Appear normal
                        previously seen
 Lips:                  Previously seen        Bladder:                Appears normal
 Thoracic:              Previously seen        Spine:                  Limited views
                                                                       appear normal
 Heart:                 Previously seen        Upper Extremities:      Previously seen
 RVOT:                  Previously seen        Lower Extremities:      Previously seen
 LVOT:                  Previously seen

 Other:  Heels/feet and open hands/5th digits previously visualized.
Cervix Uterus Adnexa

 Cervix
 Not visualized (advanced GA >96wks)
 Uterus
 No abnormality visualized.

 Right Ovary
 Within normal limits. No adnexal mass visualized.

 Left Ovary
 Within normal limits. No adnexal mass visualized.

 Cul De Sac
 No free fluid seen.

 Adnexa
 No abnormality visualized.
Impression

 Single intrauterine pregnancy here for follow up growth due to
 type 2 diabetes
 Normal anatomy with measurements consistent with dates-
 improved from prior exam.
 There is good fetal movement and amniotic fluid volume
 Normal fetal echocardiogram.
Recommendations

 Follow up growth in 3 weeks

## 2022-02-08 ENCOUNTER — Ambulatory Visit: Payer: Self-pay

## 2022-02-08 NOTE — Lactation Note (Addendum)
This note was copied from a baby's chart. ?Lactation Consultation Note ? ?Patient Name: [name] [name] ?Today's Date: 02/08/2022 ?Reason for consult: Follow-up assessment;Mother's request;Breastfeeding assistance;NICU baby;Early term 37-38.6wks ?Age:30 wk.o. ? ?Visited with mom of 58 30/93 weeks old (adjusted) NICU female, she requested latch assistance for the 12 pm feeding. 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  ? ?Work with mom on some pre-feeding activities, took baby for some STS care time during feedings and practiced finger feeding. Reviewed IDF 1/2, feeding cues, pumping schedule and ETI behavior.  ? ?Maternal Data ? Mom's supply is WNL ? ?Feeding ?Mother's Current Feeding Choice: Breast Milk ? ?LATCH Score ?Latch: Too sleepy or reluctant, no latch achieved, no sucking elicited. (reluctant baby, she became fussy during this attempt, will suck some on a gloved finger but not at the breast) ? ?Audible Swallowing: None ? ?Type of Nipple: Everted at rest and after stimulation ? ?Comfort (Breast/Nipple): Soft / non-tender ? ?Hold (Positioning): Assistance needed to correctly position infant at breast and maintain latch. (due to baby's left arm (CP)) ? ?LATCH Score: 5 ? ?Lactation Tools Discussed/Used ?Tools: Pump;Flanges ?Flange Size: 27 ?Breast pump type: Double-Electric Breast Pump ?Pump Education: Setup, frequency, and cleaning;Milk Storage ?Reason for Pumping: ETI in NICU ?Pumping frequency: 7-8 times/24 hours ?Pumped volume: 150 mL (150-240 ml) ? ?Interventions ?Interventions: Breast feeding basics reviewed;Skin to skin;Assisted with latch;Breast massage;Hand express;Breast compression;Adjust position;DEBP;Education ? ?Plan of care ?Encouraged mom to continue pumping consistently at least 8 times/24 hours ?She'll continue putting baby to breast on feeding cues when she comes to visit and NICU staff will work  on bottle feedings when parents are away ?  ?No other support person at this time. All questions and concerns answered, mom to call NICU LC PRN. ? ?Discharge ?Pump: DEBP;Personal ? ?Consult Status ?Consult Status: Follow-up ?Date: 02/08/22 ?Follow-up type: In-patient ? ? ?Valetta Mulroy S Tamre Cass ?02/08/2022, 1:30 PM ? ? ? ?

## 2022-02-14 ENCOUNTER — Ambulatory Visit: Payer: Self-pay

## 2022-02-14 NOTE — Lactation Note (Signed)
This note was copied from a baby's chart. ?Lactation Consultation Note ?FOB present and holding sleeping infant. He recalls that mother is pumping often and bringing milk to NICU. Per FOB, mom is coming to visit tomorrow. FOB requested additional bottles to take to mom, which LC provided.  ? ?Will plan f/u with mother tomorrow.  ? ?Patient Name: [name] [name] ?Today's Date: 02/14/2022 ?  ?Age:30 wk.o. ? ? ?[name] [name] ?02/14/2022, 4:41 PM ? ? ? ?

## 2022-03-02 ENCOUNTER — Ambulatory Visit: Payer: Self-pay

## 2022-03-02 NOTE — Lactation Note (Signed)
This note was copied from a baby's chart. ? ?NICU Lactation Consultation Note ? ?Patient Name: [name] [name] ?Today's Date: 03/02/2022 ?Age:31  y.o. ? ? ?Subjective ?Reason for consult: Follow-up assessment; Mother's request ? ?Telephone call: Ms. Kwolek called the lactation line to discuss her pumping plan. She has been decreasing her pumping frequency from 8 times/day to 3-5 times a day due to schedule constraints. She had some questions about her plan and wanted to discuss her options. ? ?We discussed how to gradually reduce pumping sessions if she wishes to stop altogether. She is already decreasing her frequency naturally via longer intervals between sessions, and she denies engorgement. However, she may opt to continue pumping intermittently. She wanted to know if that would be 'okay' for [name] to receive a combination of EBM with ABM.  ? ?Ms. Zehring states that her biggest priority is for [name] to "finish what I have stored for her already." Ms. Pund has some EBM in her personal freezer storage in addition to the milk lab. I reassured her that her milk would not be disposed of, and that baby would receive her milk as a priority. ? ?I did also recommend that we touch base with Speech Therapy Services to collaborate around [name]'s feeding plan, particularly if the option is mixed feeding. Ms. Essner expressed appreciation and asked that either lactation or speech follow up with her. ? ?Objective ? ?Infant feeding assessment ?Scale for Readiness: 2 ?Scale for Quality: 3 ? ?Maternal data: ?[id number]  ?Vaginal, Spontaneous ? ?Pumping frequency: q3-5 times a day ?Pumped volume: 160 mL (160 - 180) ? ? ?WIC Program: Yes ?WIC Referral Sent?:  (sending it today (2/23)) ?Pump: Personal ? ?Maternal: ?Milk volume: Normal ? ? ?Intervention/Plan ?Interventions: Education ? ?Pump Education: Setup, frequency, and cleaning ? ?Plan: ?Consult Status: Follow-up ?Follow up regarding feeding plan with advice regarding mixed  feeding.  ?NICU Follow-up type: Weekly NICU follow up ? ? ? ?Walker Shadow ?03/02/2022, 5:08 PM ?

## 2022-03-07 ENCOUNTER — Ambulatory Visit: Payer: Self-pay

## 2022-03-07 NOTE — Lactation Note (Signed)
This note was copied from a baby's chart. ?Lactation Consultation Note ? ?Patient Name: [name] [name] ?Today's Date: 03/07/2022 ?  ?Age:30 wk.o. ? ?LC in the room to visit with mom but she hasn't come to the hospital today. Called number on file but no answer, left a message asking her to call us back to check on pumping status and letting her know that [facility] services will be available to follow up the next time she comes to the unit. ? ? ?[name] S [name] ?03/07/2022, 4:03 PM ? ? ? ?

## 2022-03-10 ENCOUNTER — Ambulatory Visit: Payer: Self-pay

## 2022-03-10 NOTE — Lactation Note (Signed)
This note was copied from a baby's chart. ?Telephone call ? ?Patient Name: [name] [name] ?Today's Date: 03/10/2022 ?Reason for consult: Follow-up assessment;Other (Comment);NICU baby;Term;Maternal endocrine disorder (Telephone call) ?Age:30 wk.o. ? ?LC in the room to visit with mom but NICU RN reported she hasn't come to the hospital yet. Spoke to [name] [name] over the phone and she voiced that she's working on weaning from the pump, she's still producing milk but not as much as she used to, he supply has decreased since the last time she saw lactation.  ? ?Explained to [name] [name] that if this is within her goals, we can continue working on gradually decreasing her supply, the gradual transition will make it easier on her breasts and also on baby's stomach, baby "[name] [name]" is already getting mixed feedings of breastmilk + Similac 20 calorie formula. ? ?Maternal Data ? Maternal supply continues to dwindle due to decreased pumping ? ?Feeding ?Mother's Current Feeding Choice: Breast Milk and Formula ? ?Lactation Tools Discussed/Used ?Tools: Pump;Flanges ?Flange Size: 21 ?Breast pump type: Double-Electric Breast Pump ?Pump Education: Setup, frequency, and cleaning;Milk Storage ?Reason for Pumping: NICU infant ?Pumping frequency: 3 times/24 hours ?Pumped volume: 60 mL (120 ml in the AM) ? ?Interventions ?Interventions: Education ? ?Plan of care ?Encouraged mom to continue pumping at her own pace and on her own schedule ?She'll continue working on bottle feedings with baby "[name] [name]" ?  ?All questions and concerns answered, mom to call NICU LC PRN. ? ?Discharge ?Pump: DEBP;Personal ([place]oh) ? ?Consult Status ?Consult Status: Follow-up ?Date: 03/10/22 ?Follow-up type: In-patient ? ? ?[name] [name] ?03/10/2022, 4:51 PM ? ? ? ?

## 2022-03-15 ENCOUNTER — Ambulatory Visit: Payer: Self-pay

## 2022-03-15 NOTE — Lactation Note (Signed)
This note was copied from a baby's chart. ?Lactation Consultation Note ? ?Patient Name: [name] [name] ?Today's Date: 03/15/2022 ?Reason for consult: Follow-up assessment;NICU baby;Maternal endocrine disorder;Term;Other (Comment) (Erb's Palsy) ?Age:30 m.o. ? ?Visited with mom of 69 42/65 weeks old (adjusted) NICU female, she's taking baby home today. Reviewed discharge education and pumping plan, Ms. [name] [name] is still on the process of weaning from pumping but she still has a "stash" of frozen milk at home. She asked for alternatives on what to give to baby once her EBM is all gone, baby "[name] [name]" is still getting 1/2 breastmilk and 1/2 formula. Let her know that baby most likely will be transitioning to formula but that baby's doctor will provide more details about it. ? ?Ms. [name] [name] denied any breast pain/discomfort at this time, even when her breasts feel full. FOB present. All questions and concerns answered, family to contact [facility] services PRN. ? ?Feeding ?Mother's Current Feeding Choice: Breast Milk and Formula ?Nipple Type: Dr. Irving [name] level 4 ? ?Lactation Tools Discussed/Used ?Tools: Pump;Flanges ?Flange Size: 21 ?Breast pump type: Double-Electric Breast Pump ?Pump Education: Setup, frequency, and cleaning;Milk Storage ?Reason for Pumping: NICU infant ?Pumping frequency: 1 times/24 hours ?Pumped volume: 30 mL ? ?Interventions ?Interventions: Education ? ?Discharge ?Pump: DEBP;Personal (L[place]inoh) ? ?Consult Status ?Consult Status: Complete ?Date: 03/15/22 ?Follow-up type: Call as needed ? ? ?[name] [name] S [name] [name] ?03/15/2022, 4:45 PM ? ? ? ?

## 2022-11-24 NOTE — L&D Delivery Note (Signed)
Operative Delivery Note CTBS due to shoulder dystocia. See [facility] CNM's note. When this author arrived at [facility], pt was on hands and knees and it had been > 1 minute since start of shoulder dystocia. [facility] CNM requested  that I attempt delivery. Faculty Practice attendings were also called. Both were in OR. I did not apply traction on head. Attempted to reach posterior axilla with pt in hands and knees but unable to grasp. Turned pt to back, RN's performed McRoberts while CNM grasped posterior (right) axilla. RN applied suprapubic pressure while CNM applied moderate traction on axilla and attempted rotation. Unable to deliver arm. CNM requested that suprapubic pressure stop, was able to better grasp posterior axilla and slightly rotated shoulders counterclockwise and thus free the anterior shoulder from behind pubic bone and enabling delivery of posterior shoulder. Dr. Charlotta Newton, faculty practice attending also arrived in room at that time. Saw that CNM had successfully released shoulder dystocia and was able to delivery the posterior arm followed by rest of body through rotational maneuvers. The abdomen was very tight and slow to deliver. Care was taken to only use traction on axillae and not abdomen.   At 10:36 AM a viable female was delivered via Vaginal, Spontaneous.  Presentation: vertex; Position: Right Occiput Anterior. Infant with low tone, no respiratory effort so cord clamped and cut immediately and baby taken to warmer to NICU team.  Delivery of the head: 05/08/2023 10:33 AM First maneuver: 05/08/2023 10:33 AM, McRoberts Second maneuver: 05/08/2023 10:34 AM, Suprapubic Pressure Gaskin (hands/knees) Third maneuver: 05/08/2023 10:35 AM, Posterior arm delivered Fourth maneuver: 05/08/2023 10:35 AM, Suprapubic PressureMcRoberts Fifth maneuver: 05/08/2023 10:36 AM, Posterior arm delivered Sixth maneuver: ,    APGAR: 2, 8; weight 8 lb 7.5 oz (3840 g).   [facility] CNM resumed care of pt after baby taken to warmer for  NICU eval.  See [facility] CNM note for remaining delivery details.   Dorathy Kinsman, CNM 05/08/2023, 6:19 PM

## 2022-11-24 NOTE — L&D Delivery Note (Signed)
Delivery Note Labor onset: 05/08/2023  Labor Onset Time: 0923 Complete dilation at 10:25 AM  Onset of pushing at 1031 FHR second stage Cat 2 Analgesia/Anesthesia intrapartum: Epidural was placed, unable to achieve relief R/T precipitous labor  Called to room for precipitous labor. Pt became fully dilated 62 min after AROM. CNM to bedside, pt sitting on side of bed with Dr. Freida Busman placing epidural cath. Involuntary pushing observed, pt assisted to left lateral position and bed prepared for birth.  Crowning noted, strong maternal pushing effort, head delivered with immediate turtle sign. Head did not restitute. Downward, axial traction, suprapubic pressure from maternal right, and Gaskin's maneuver did not release shoulder. CNM requested assistance from attending and in-house provider. See note from in-house CNM for birth details..    Nuchal cord: Yes.  Cord double clamped immediately and newborn handed to NICU team.   Cord blood sample collected: Yes Arterial cord blood sample collected: Yes arterial pH 7.2, venous pH 7.3  Delivery of placenta by this CNM  Placenta delivered Schultz, intact, with 3 VC.  Placenta to L&D . Uterine tone firm, bleeding minimal, no clots  No laceration identified.   QBL/EBL (mL): 150 Complications: shoulder dystocia, low 1-min apgar score APGAR: APGAR (1 MIN): 2   APGAR (5 MINS): 8   APGAR (10 MINS):   Infant placed skin-to-skin after resuscitation and NICU assessment. Infant to remain with mother and be evaluated for ROM of upper extremities.   Dr. Sallye Ober made aware of shoulder dystocia and birth outcome.     Roma Schanz DNP, CNM 05/08/2023, 12:24 PM

## 2022-12-23 ENCOUNTER — Other Ambulatory Visit: Payer: Self-pay | Admitting: Obstetrics and Gynecology

## 2022-12-23 DIAGNOSIS — Z363 Encounter for antenatal screening for malformations: Secondary | ICD-10-CM

## 2022-12-23 LAB — OB RESULTS CONSOLE ABO/RH: RH Type: POSITIVE

## 2022-12-23 LAB — OB RESULTS CONSOLE HEPATITIS B SURFACE ANTIGEN: Hepatitis B Surface Ag: NEGATIVE

## 2022-12-23 LAB — OB RESULTS CONSOLE HIV ANTIBODY (ROUTINE TESTING): HIV: NONREACTIVE

## 2022-12-23 LAB — OB RESULTS CONSOLE RPR: RPR: NONREACTIVE

## 2022-12-23 LAB — OB RESULTS CONSOLE ANTIBODY SCREEN: Antibody Screen: NEGATIVE

## 2022-12-23 LAB — OB RESULTS CONSOLE RUBELLA ANTIBODY, IGM: Rubella: IMMUNE

## 2023-01-12 LAB — OB RESULTS CONSOLE GC/CHLAMYDIA
Chlamydia: NEGATIVE
Neisseria Gonorrhea: NEGATIVE

## 2023-01-14 ENCOUNTER — Encounter: Payer: Self-pay | Admitting: *Deleted

## 2023-01-15 ENCOUNTER — Ambulatory Visit: Payer: Medicaid Other

## 2023-02-27 ENCOUNTER — Ambulatory Visit: Payer: Medicaid Other | Attending: Obstetrics and Gynecology | Admitting: *Deleted

## 2023-02-27 ENCOUNTER — Ambulatory Visit (HOSPITAL_BASED_OUTPATIENT_CLINIC_OR_DEPARTMENT_OTHER): Payer: Medicaid Other

## 2023-02-27 ENCOUNTER — Other Ambulatory Visit: Payer: Self-pay | Admitting: *Deleted

## 2023-02-27 VITALS — BP 98/59 | HR 97

## 2023-02-27 DIAGNOSIS — O99213 Obesity complicating pregnancy, third trimester: Secondary | ICD-10-CM | POA: Diagnosis not present

## 2023-02-27 DIAGNOSIS — Z363 Encounter for antenatal screening for malformations: Secondary | ICD-10-CM | POA: Diagnosis not present

## 2023-02-27 DIAGNOSIS — O24113 Pre-existing diabetes mellitus, type 2, in pregnancy, third trimester: Secondary | ICD-10-CM

## 2023-02-27 DIAGNOSIS — O09893 Supervision of other high risk pregnancies, third trimester: Secondary | ICD-10-CM

## 2023-02-27 DIAGNOSIS — E669 Obesity, unspecified: Secondary | ICD-10-CM | POA: Insufficient documentation

## 2023-02-27 DIAGNOSIS — Z3A26 26 weeks gestation of pregnancy: Secondary | ICD-10-CM | POA: Diagnosis not present

## 2023-02-27 DIAGNOSIS — O09293 Supervision of pregnancy with other poor reproductive or obstetric history, third trimester: Secondary | ICD-10-CM | POA: Insufficient documentation

## 2023-02-27 DIAGNOSIS — O0933 Supervision of pregnancy with insufficient antenatal care, third trimester: Secondary | ICD-10-CM | POA: Insufficient documentation

## 2023-03-27 ENCOUNTER — Ambulatory Visit: Payer: Medicaid Other | Attending: Obstetrics

## 2023-03-27 ENCOUNTER — Ambulatory Visit: Payer: Medicaid Other | Admitting: *Deleted

## 2023-03-27 VITALS — BP 100/64 | HR 89

## 2023-03-27 DIAGNOSIS — O09213 Supervision of pregnancy with history of pre-term labor, third trimester: Secondary | ICD-10-CM | POA: Diagnosis not present

## 2023-03-27 DIAGNOSIS — Z3A32 32 weeks gestation of pregnancy: Secondary | ICD-10-CM

## 2023-03-27 DIAGNOSIS — E119 Type 2 diabetes mellitus without complications: Secondary | ICD-10-CM | POA: Diagnosis not present

## 2023-03-27 DIAGNOSIS — O09893 Supervision of other high risk pregnancies, third trimester: Secondary | ICD-10-CM | POA: Insufficient documentation

## 2023-03-27 DIAGNOSIS — O99213 Obesity complicating pregnancy, third trimester: Secondary | ICD-10-CM

## 2023-03-27 DIAGNOSIS — O24113 Pre-existing diabetes mellitus, type 2, in pregnancy, third trimester: Secondary | ICD-10-CM | POA: Diagnosis present

## 2023-03-27 DIAGNOSIS — O0933 Supervision of pregnancy with insufficient antenatal care, third trimester: Secondary | ICD-10-CM

## 2023-03-27 DIAGNOSIS — E669 Obesity, unspecified: Secondary | ICD-10-CM

## 2023-03-27 DIAGNOSIS — O09293 Supervision of pregnancy with other poor reproductive or obstetric history, third trimester: Secondary | ICD-10-CM | POA: Diagnosis present

## 2023-03-27 DIAGNOSIS — Z7984 Long term (current) use of oral hypoglycemic drugs: Secondary | ICD-10-CM

## 2023-04-03 ENCOUNTER — Ambulatory Visit: Payer: Medicaid Other

## 2023-04-03 ENCOUNTER — Encounter: Payer: Self-pay | Admitting: *Deleted

## 2023-04-03 ENCOUNTER — Ambulatory Visit: Payer: Medicaid Other | Admitting: *Deleted

## 2023-04-03 ENCOUNTER — Ambulatory Visit: Payer: Medicaid Other | Attending: Obstetrics | Admitting: *Deleted

## 2023-04-03 VITALS — BP 121/79 | HR 84

## 2023-04-03 DIAGNOSIS — O24415 Gestational diabetes mellitus in pregnancy, controlled by oral hypoglycemic drugs: Secondary | ICD-10-CM | POA: Diagnosis present

## 2023-04-03 DIAGNOSIS — O99213 Obesity complicating pregnancy, third trimester: Secondary | ICD-10-CM | POA: Insufficient documentation

## 2023-04-03 DIAGNOSIS — Z3A33 33 weeks gestation of pregnancy: Secondary | ICD-10-CM | POA: Insufficient documentation

## 2023-04-03 DIAGNOSIS — O24113 Pre-existing diabetes mellitus, type 2, in pregnancy, third trimester: Secondary | ICD-10-CM

## 2023-04-03 NOTE — Progress Notes (Signed)
218LBS

## 2023-04-03 NOTE — Procedures (Addendum)
[name] [name] 05-Sep-1991 [redacted]w[redacted]d  Fetus A Non-Stress Test Interpretation for 04/03/23  Indication: Diabetes and obese  Fetal Heart Rate A Mode: External Baseline Rate (A): 145 bpm Variability: Moderate Accelerations: 15 x 15 Multiple birth?: No  Uterine Activity Mode: Toco Contraction Frequency (min): 3-10 Contraction Duration (sec): 60-120 Contraction Quality: Mild (not felt by pt) Resting Tone Palpated: Relaxed  Interpretation (Fetal Testing) Nonstress Test Interpretation: Reactive Overall Impression: Reassuring for gestational age Comments: Tracing reviewed byDr.Schaible

## 2023-04-09 ENCOUNTER — Encounter: Payer: Self-pay | Admitting: *Deleted

## 2023-04-10 ENCOUNTER — Ambulatory Visit (HOSPITAL_BASED_OUTPATIENT_CLINIC_OR_DEPARTMENT_OTHER): Payer: Medicaid Other | Admitting: *Deleted

## 2023-04-10 ENCOUNTER — Ambulatory Visit: Payer: Medicaid Other

## 2023-04-10 ENCOUNTER — Ambulatory Visit: Payer: Medicaid Other | Attending: Obstetrics | Admitting: *Deleted

## 2023-04-10 VITALS — BP 109/68 | HR 90

## 2023-04-10 DIAGNOSIS — O24113 Pre-existing diabetes mellitus, type 2, in pregnancy, third trimester: Secondary | ICD-10-CM

## 2023-04-10 DIAGNOSIS — Z3A34 34 weeks gestation of pregnancy: Secondary | ICD-10-CM

## 2023-04-10 DIAGNOSIS — E119 Type 2 diabetes mellitus without complications: Secondary | ICD-10-CM | POA: Insufficient documentation

## 2023-04-10 DIAGNOSIS — Z3A33 33 weeks gestation of pregnancy: Secondary | ICD-10-CM | POA: Insufficient documentation

## 2023-04-10 NOTE — Procedures (Signed)
[name] [name] September 16, 1991 [redacted]w[redacted]d  Fetus A Non-Stress Test Interpretation for 04/10/23  Indication: Diabetes  Fetal Heart Rate A Mode: External Baseline Rate (A): 145 bpm Variability: Moderate Accelerations: 15 x 15 Decelerations: None Multiple birth?: No  Uterine Activity Mode: Palpation, Toco Contraction Frequency (min): Occas Contraction Quality: Mild Resting Tone Palpated: Relaxed Resting Time: Adequate  Interpretation (Fetal Testing) Nonstress Test Interpretation: Reactive Comments: Dr. Parke Poisson reviewed tracing.

## 2023-04-13 ENCOUNTER — Other Ambulatory Visit: Payer: Self-pay

## 2023-04-13 DIAGNOSIS — O24113 Pre-existing diabetes mellitus, type 2, in pregnancy, third trimester: Secondary | ICD-10-CM

## 2023-04-17 ENCOUNTER — Ambulatory Visit: Payer: Medicaid Other

## 2023-04-17 ENCOUNTER — Ambulatory Visit (HOSPITAL_BASED_OUTPATIENT_CLINIC_OR_DEPARTMENT_OTHER): Payer: Medicaid Other | Admitting: *Deleted

## 2023-04-17 ENCOUNTER — Other Ambulatory Visit: Payer: Self-pay | Admitting: *Deleted

## 2023-04-17 ENCOUNTER — Ambulatory Visit: Payer: Medicaid Other | Attending: Obstetrics | Admitting: *Deleted

## 2023-04-17 VITALS — BP 118/73 | HR 101

## 2023-04-17 DIAGNOSIS — O24113 Pre-existing diabetes mellitus, type 2, in pregnancy, third trimester: Secondary | ICD-10-CM

## 2023-04-17 DIAGNOSIS — Z3A35 35 weeks gestation of pregnancy: Secondary | ICD-10-CM | POA: Diagnosis not present

## 2023-04-17 NOTE — Procedures (Signed)
[name] [name] 29-Mar-1991 [redacted]w[redacted]d  Fetus A Non-Stress Test Interpretation for 04/17/23  Indication: Diabetes  Fetal Heart Rate A Mode: External Baseline Rate (A): 140 bpm Variability: Moderate Accelerations: 15 x 15 Decelerations: None Multiple birth?: No  Uterine Activity Mode: Toco Contraction Frequency (min): none Resting Tone Palpated: Relaxed  Interpretation (Fetal Testing) Nonstress Test Interpretation: Reactive Overall Impression: Reassuring for gestational age Comments: Tracing reviewed byDr. Parke Poisson

## 2023-04-23 ENCOUNTER — Ambulatory Visit: Payer: Medicaid Other | Attending: Obstetrics

## 2023-04-23 ENCOUNTER — Ambulatory Visit: Payer: Medicaid Other | Admitting: *Deleted

## 2023-04-23 VITALS — BP 123/68 | HR 90

## 2023-04-23 DIAGNOSIS — O403XX0 Polyhydramnios, third trimester, not applicable or unspecified: Secondary | ICD-10-CM

## 2023-04-23 DIAGNOSIS — O99213 Obesity complicating pregnancy, third trimester: Secondary | ICD-10-CM

## 2023-04-23 DIAGNOSIS — E119 Type 2 diabetes mellitus without complications: Secondary | ICD-10-CM

## 2023-04-23 DIAGNOSIS — O09293 Supervision of pregnancy with other poor reproductive or obstetric history, third trimester: Secondary | ICD-10-CM

## 2023-04-23 DIAGNOSIS — O24113 Pre-existing diabetes mellitus, type 2, in pregnancy, third trimester: Secondary | ICD-10-CM | POA: Insufficient documentation

## 2023-04-23 DIAGNOSIS — Z3A35 35 weeks gestation of pregnancy: Secondary | ICD-10-CM

## 2023-04-23 DIAGNOSIS — Z148 Genetic carrier of other disease: Secondary | ICD-10-CM

## 2023-04-23 DIAGNOSIS — O285 Abnormal chromosomal and genetic finding on antenatal screening of mother: Secondary | ICD-10-CM

## 2023-04-23 DIAGNOSIS — E669 Obesity, unspecified: Secondary | ICD-10-CM

## 2023-04-23 DIAGNOSIS — O0933 Supervision of pregnancy with insufficient antenatal care, third trimester: Secondary | ICD-10-CM

## 2023-04-23 DIAGNOSIS — O09213 Supervision of pregnancy with history of pre-term labor, third trimester: Secondary | ICD-10-CM

## 2023-04-27 ENCOUNTER — Other Ambulatory Visit: Payer: Medicaid Other

## 2023-04-27 ENCOUNTER — Ambulatory Visit: Payer: Medicaid Other

## 2023-04-28 ENCOUNTER — Other Ambulatory Visit: Payer: Self-pay | Admitting: Obstetrics & Gynecology

## 2023-04-29 ENCOUNTER — Encounter: Payer: Self-pay | Admitting: *Deleted

## 2023-04-29 DIAGNOSIS — O99210 Obesity complicating pregnancy, unspecified trimester: Secondary | ICD-10-CM | POA: Insufficient documentation

## 2023-05-01 ENCOUNTER — Ambulatory Visit: Payer: Medicaid Other | Admitting: *Deleted

## 2023-05-01 ENCOUNTER — Ambulatory Visit: Payer: Medicaid Other | Attending: Obstetrics and Gynecology | Admitting: *Deleted

## 2023-05-01 VITALS — BP 113/64 | HR 86

## 2023-05-01 DIAGNOSIS — O24415 Gestational diabetes mellitus in pregnancy, controlled by oral hypoglycemic drugs: Secondary | ICD-10-CM | POA: Insufficient documentation

## 2023-05-01 DIAGNOSIS — Z3A37 37 weeks gestation of pregnancy: Secondary | ICD-10-CM | POA: Insufficient documentation

## 2023-05-01 DIAGNOSIS — O24113 Pre-existing diabetes mellitus, type 2, in pregnancy, third trimester: Secondary | ICD-10-CM

## 2023-05-01 NOTE — Procedures (Addendum)
[name] [name] [birth year] [redacted]w[redacted]d  Fetus A Non-Stress Test Interpretation for 05/01/23  Indication: Diabetes  Fetal Heart Rate A Mode: External Baseline Rate (A): 145 bpm Variability: Moderate Accelerations: 15 x 15 Decelerations: None Multiple birth?: No  Uterine Activity Mode: Toco Contraction Frequency (min): none Resting Tone Palpated: Relaxed  Interpretation (Fetal Testing) Nonstress Test Interpretation: Reactive Overall Impression: Reassuring for gestational age Comments: Tracing reviewed by Dr. Darra Lis

## 2023-05-05 ENCOUNTER — Telehealth (HOSPITAL_COMMUNITY): Payer: Self-pay | Admitting: *Deleted

## 2023-05-05 NOTE — Telephone Encounter (Signed)
Preadmission screen  

## 2023-05-07 ENCOUNTER — Encounter (HOSPITAL_COMMUNITY): Payer: Self-pay | Admitting: *Deleted

## 2023-05-08 ENCOUNTER — Inpatient Hospital Stay (HOSPITAL_COMMUNITY): Payer: Medicaid Other | Admitting: Anesthesiology

## 2023-05-08 ENCOUNTER — Inpatient Hospital Stay (HOSPITAL_COMMUNITY)
Admission: RE | Admit: 2023-05-08 | Discharge: 2023-05-10 | DRG: 805 | Disposition: A | Discharge status: Home / Self Care | Payer: Medicaid Other | Attending: Obstetrics & Gynecology | Admitting: Obstetrics & Gynecology

## 2023-05-08 ENCOUNTER — Other Ambulatory Visit: Payer: Self-pay

## 2023-05-08 ENCOUNTER — Other Ambulatory Visit: Payer: Medicaid Other

## 2023-05-08 ENCOUNTER — Inpatient Hospital Stay (HOSPITAL_COMMUNITY): Payer: Medicaid Other

## 2023-05-08 ENCOUNTER — Encounter (HOSPITAL_COMMUNITY): Payer: Self-pay | Admitting: Obstetrics & Gynecology

## 2023-05-08 ENCOUNTER — Ambulatory Visit: Payer: Medicaid Other

## 2023-05-08 DIAGNOSIS — Z148 Genetic carrier of other disease: Secondary | ICD-10-CM | POA: Diagnosis not present

## 2023-05-08 DIAGNOSIS — O99214 Obesity complicating childbirth: Secondary | ICD-10-CM | POA: Diagnosis present

## 2023-05-08 DIAGNOSIS — Z3A38 38 weeks gestation of pregnancy: Secondary | ICD-10-CM

## 2023-05-08 DIAGNOSIS — O365930 Maternal care for other known or suspected poor fetal growth, third trimester, not applicable or unspecified: Secondary | ICD-10-CM | POA: Diagnosis present

## 2023-05-08 DIAGNOSIS — O2412 Pre-existing diabetes mellitus, type 2, in childbirth: Secondary | ICD-10-CM | POA: Diagnosis present

## 2023-05-08 DIAGNOSIS — O09293 Supervision of pregnancy with other poor reproductive or obstetric history, third trimester: Secondary | ICD-10-CM

## 2023-05-08 DIAGNOSIS — O3663X0 Maternal care for excessive fetal growth, third trimester, not applicable or unspecified: Secondary | ICD-10-CM | POA: Diagnosis not present

## 2023-05-08 DIAGNOSIS — O403XX0 Polyhydramnios, third trimester, not applicable or unspecified: Secondary | ICD-10-CM | POA: Diagnosis present

## 2023-05-08 DIAGNOSIS — O24420 Gestational diabetes mellitus in childbirth, diet controlled: Secondary | ICD-10-CM | POA: Diagnosis not present

## 2023-05-08 DIAGNOSIS — O09823 Supervision of pregnancy with history of in utero procedure during previous pregnancy, third trimester: Secondary | ICD-10-CM | POA: Diagnosis not present

## 2023-05-08 DIAGNOSIS — Z87891 Personal history of nicotine dependence: Secondary | ICD-10-CM

## 2023-05-08 LAB — CBC
HCT: 39.8 % (ref 36.0–46.0)
Hemoglobin: 13.4 g/dL (ref 12.0–15.0)
MCH: 28.9 pg (ref 26.0–34.0)
MCHC: 33.7 g/dL (ref 30.0–36.0)
MCV: 85.8 fL (ref 80.0–100.0)
Platelets: 241 10*3/uL (ref 150–400)
RBC: 4.64 MIL/uL (ref 3.87–5.11)
RDW: 12.7 % (ref 11.5–15.5)
WBC: 6.8 10*3/uL (ref 4.0–10.5)
nRBC: 0 % (ref 0.0–0.2)

## 2023-05-08 LAB — COMPREHENSIVE METABOLIC PANEL
ALT: 24 U/L (ref 0–44)
AST: 17 U/L (ref 15–41)
Albumin: 2.8 g/dL — ABNORMAL LOW (ref 3.5–5.0)
Alkaline Phosphatase: 91 U/L (ref 38–126)
Anion gap: 10 (ref 5–15)
BUN: 10 mg/dL (ref 6–20)
CO2: 19 mmol/L — ABNORMAL LOW (ref 22–32)
Calcium: 9.3 mg/dL (ref 8.9–10.3)
Chloride: 106 mmol/L (ref 98–111)
Creatinine, Ser: 0.64 mg/dL (ref 0.44–1.00)
GFR, Estimated: >60 mL/min (ref >60)
Glucose, Bld: 179 mg/dL — ABNORMAL HIGH (ref 70–99)
Potassium: 3.6 mmol/L (ref 3.5–5.1)
Sodium: 135 mmol/L (ref 135–145)
Total Bilirubin: 1.2 mg/dL (ref 0.3–1.2)
Total Protein: 6.1 g/dL — ABNORMAL LOW (ref 6.5–8.1)

## 2023-05-08 LAB — TYPE AND SCREEN
ABO/RH(D): AB POS
Antibody Screen: POSITIVE
Unit division: 0
Unit division: 0

## 2023-05-08 LAB — GROUP B STREP BY PCR: Group B strep by PCR: NEGATIVE

## 2023-05-08 LAB — RPR: RPR Ser Ql: NONREACTIVE

## 2023-05-08 LAB — GLUCOSE, CAPILLARY: Glucose-Capillary: 217 mg/dL — ABNORMAL HIGH (ref 70–99)

## 2023-05-08 MED ORDER — LACTATED RINGERS IV SOLN: 500.0000 mL | Freq: Once | INTRAVENOUS | Status: DC

## 2023-05-08 MED ORDER — EPHEDRINE 5 MG/ML INJ: 10.0000 mg | INTRAVENOUS | Status: DC | PRN

## 2023-05-08 MED ORDER — OXYTOCIN-SODIUM CHLORIDE 30-0.9 UT/500ML-% IV SOLN
1.0000 m[IU]/min | INTRAVENOUS | Status: DC
  Administered 2023-05-08: 1 m[IU]/min via INTRAVENOUS
  Filled 2023-05-08: qty 500

## 2023-05-08 MED ORDER — ACETAMINOPHEN 325 MG PO TABS
650.0000 mg | ORAL_TABLET | ORAL | Status: DC | PRN
  Administered 2023-05-08: 650 mg via ORAL
  Filled 2023-05-08: qty 2

## 2023-05-08 MED ORDER — OXYTOCIN-SODIUM CHLORIDE 30-0.9 UT/500ML-% IV SOLN: 1.0000 m[IU]/min | INTRAVENOUS | Status: DC

## 2023-05-08 MED ORDER — DIBUCAINE (PERIANAL) 1 % EX OINT: 1.0000 | TOPICAL_OINTMENT | CUTANEOUS | Status: DC | PRN

## 2023-05-08 MED ORDER — PHENYLEPHRINE 80 MCG/ML (10ML) SYRINGE FOR IV PUSH (FOR BLOOD PRESSURE SUPPORT): 80.0000 ug | PREFILLED_SYRINGE | INTRAVENOUS | Status: DC | PRN

## 2023-05-08 MED ORDER — ONDANSETRON HCL 4 MG PO TABS: 4.0000 mg | ORAL_TABLET | ORAL | Status: DC | PRN

## 2023-05-08 MED ORDER — OXYCODONE HCL 5 MG PO TABS: 10.0000 mg | ORAL_TABLET | ORAL | Status: DC | PRN

## 2023-05-08 MED ORDER — SIMETHICONE 80 MG PO CHEW: 80.0000 mg | CHEWABLE_TABLET | ORAL | Status: DC | PRN

## 2023-05-08 MED ORDER — LIDOCAINE HCL (PF) 1 % IJ SOLN
INTRAMUSCULAR | Status: DC | PRN
  Administered 2023-05-08: 5 mL via EPIDURAL
  Administered 2023-05-08: 3 mL via EPIDURAL

## 2023-05-08 MED ORDER — OXYTOCIN-SODIUM CHLORIDE 30-0.9 UT/500ML-% IV SOLN: 2.5000 [IU]/h | INTRAVENOUS | Status: DC

## 2023-05-08 MED ORDER — WITCH HAZEL-GLYCERIN EX PADS: 1.0000 | MEDICATED_PAD | CUTANEOUS | Status: DC | PRN

## 2023-05-08 MED ORDER — OXYCODONE HCL 5 MG PO TABS: 5.0000 mg | ORAL_TABLET | ORAL | Status: DC | PRN

## 2023-05-08 MED ORDER — BENZOCAINE-MENTHOL 20-0.5 % EX AERO: 1.0000 | INHALATION_SPRAY | CUTANEOUS | Status: DC | PRN

## 2023-05-08 MED ORDER — LIDOCAINE HCL (PF) 1 % IJ SOLN: 30.0000 mL | INTRAMUSCULAR | Status: DC | PRN

## 2023-05-08 MED ORDER — SENNOSIDES-DOCUSATE SODIUM 8.6-50 MG PO TABS
2.0000 | ORAL_TABLET | ORAL | Status: DC
  Administered 2023-05-08 – 2023-05-10 (×3): 2 via ORAL
  Filled 2023-05-08 (×3): qty 2

## 2023-05-08 MED ORDER — DIPHENHYDRAMINE HCL 50 MG/ML IJ SOLN: 12.5000 mg | INTRAMUSCULAR | Status: DC | PRN

## 2023-05-08 MED ORDER — DIPHENHYDRAMINE HCL 25 MG PO CAPS: 25.0000 mg | ORAL_CAPSULE | Freq: Four times a day (QID) | ORAL | Status: DC | PRN

## 2023-05-08 MED ORDER — FLEET ENEMA 7-19 GM/118ML RE ENEM: 1.0000 | ENEMA | RECTAL | Status: DC | PRN

## 2023-05-08 MED ORDER — ONDANSETRON HCL 4 MG/2ML IJ SOLN: 4.0000 mg | INTRAMUSCULAR | Status: DC | PRN

## 2023-05-08 MED ORDER — SOD CITRATE-CITRIC ACID 500-334 MG/5ML PO SOLN: 30.0000 mL | ORAL | Status: DC | PRN

## 2023-05-08 MED ORDER — IBUPROFEN 600 MG PO TABS
600.0000 mg | ORAL_TABLET | Freq: Four times a day (QID) | ORAL | Status: DC
  Administered 2023-05-08 – 2023-05-10 (×9): 600 mg via ORAL
  Filled 2023-05-08 (×9): qty 1

## 2023-05-08 MED ORDER — TETANUS-DIPHTH-ACELL PERTUSSIS 5-2.5-18.5 LF-MCG/0.5 IM SUSY: 0.5000 mL | PREFILLED_SYRINGE | Freq: Once | INTRAMUSCULAR | Status: DC

## 2023-05-08 MED ORDER — OXYTOCIN 10 UNIT/ML IJ SOLN
10.0000 [IU] | Freq: Once | INTRAMUSCULAR | Status: AC
  Administered 2023-05-08: 10 [IU] via INTRAMUSCULAR

## 2023-05-08 MED ORDER — ACETAMINOPHEN 325 MG PO TABS
650.0000 mg | ORAL_TABLET | ORAL | Status: DC | PRN
  Administered 2023-05-09 (×2): 650 mg via ORAL
  Filled 2023-05-08 (×2): qty 2

## 2023-05-08 MED ORDER — LACTATED RINGERS IV SOLN: INTRAVENOUS | Status: DC

## 2023-05-08 MED ORDER — TERBUTALINE SULFATE 1 MG/ML IJ SOLN: 0.2500 mg | Freq: Once | INTRAMUSCULAR | Status: DC | PRN

## 2023-05-08 MED ORDER — COCONUT OIL OIL: 1.0000 | TOPICAL_OIL | Status: DC | PRN

## 2023-05-08 MED ORDER — FENTANYL-BUPIVACAINE-NACL 0.5-0.125-0.9 MG/250ML-% EP SOLN
12.0000 mL/h | EPIDURAL | Status: DC | PRN
  Filled 2023-05-08: qty 250

## 2023-05-08 MED ORDER — PRENATAL MULTIVITAMIN CH
1.0000 | ORAL_TABLET | Freq: Every day | ORAL | Status: DC
  Administered 2023-05-08 – 2023-05-10 (×3): 1 via ORAL
  Filled 2023-05-08 (×3): qty 1

## 2023-05-08 MED ORDER — OXYTOCIN 10 UNIT/ML IJ SOLN
INTRAMUSCULAR | Status: AC
  Filled 2023-05-08: qty 1

## 2023-05-08 MED ORDER — LACTATED RINGERS IV SOLN: 500.0000 mL | INTRAVENOUS | Status: DC | PRN

## 2023-05-08 MED ORDER — ONDANSETRON HCL 4 MG/2ML IJ SOLN: 4.0000 mg | Freq: Four times a day (QID) | INTRAMUSCULAR | Status: DC | PRN

## 2023-05-08 MED ORDER — OXYTOCIN BOLUS FROM INFUSION: 333.0000 mL | Freq: Once | INTRAVENOUS | Status: DC

## 2023-05-08 NOTE — Anesthesia Procedure Notes (Signed)
Epidural Patient location during procedure: OB Start time: 05/08/2023 10:23 AM End time: 05/08/2023 10:28 AM  Staffing Anesthesiologist: Linton Rump, MD Performed: anesthesiologist   Preanesthetic Checklist Completed: patient identified, IV checked, site marked, risks and benefits discussed, surgical consent, monitors and equipment checked, pre-op evaluation and timeout performed  Epidural Patient position: sitting Prep: DuraPrep and site prepped and draped Patient monitoring: continuous pulse ox and blood pressure Approach: midline Location: L3-L4 Injection technique: LOR saline  Needle:  Needle type: Tuohy  Needle gauge: 17 G Needle length: 9 cm and 9 Needle insertion depth: 7 cm Catheter type: closed end flexible Catheter size: 19 Gauge Catheter at skin depth: 11 cm Test dose: negative  Assessment Events: blood not aspirated, no cerebrospinal fluid, injection not painful, no injection resistance, no paresthesia and negative IV test  Additional Notes The patient has requested an epidural for labor pain management. Risks and benefits including, but not limited to, infection, bleeding, local anesthetic toxicity, headache, hypotension, back pain, block failure, etc. were discussed with the patient. The patient expressed understanding and consented to the procedure. I confirmed that the patient has no bleeding disorders and is not taking blood thinners. I confirmed the patient's last platelet count with the nurse. A time-out was performed immediately prior to the procedure. Please see nursing documentation for vital signs. Sterile technique was used throughout the whole procedure. Once LOR achieved, the epidural catheter threaded easily without resistance. Aspiration of the catheter was negative for blood and CSF. The epidural was dosed slowly and an infusion was started.  1 attempt(s)Reason for block:procedure for pain

## 2023-05-08 NOTE — H&P (Signed)
OB ADMISSION/ HISTORY & PHYSICAL:  Admission Date: 05/08/2023  4:56 AM  Admit Diagnosis: Pre-existing Type 2 DM  [name] [name] is a 32 y.o. female [id number] [redacted]w[redacted]d presenting for IOL for Type 2 DM, polyhydramnios, and hx of 4 min shoulder dystocia. Delivery at 38 wks recommended by MFM for pre-gestational diabetes (A2c 9.2 on NOB labs)  with a large for gestational age fetus and polyhydramnios. Endorses active FM, denies LOF and vaginal bleeding.   Fetal echo was normal, cardiology recommends a repeat echo at 2-3 months postnatal or PRN for cardiac concerns.  History of current pregnancy: [id number]   Patient entered care with [facility] at 18+4 wks.   EDC 05/22/23 by LMP    Anatomy scan:  completed at 28 wks with MFM, anatomy complete w/ posterior placenta.   Antenatal testing: for Type 2 DM started at 32 weeks Last evaluation: 35 wks cephalic/ posterior placenta/ EFW 6+13 (80%)/ AFI 27.6   Significant prenatal events:  Patient Active Problem List   Diagnosis Date Noted   Pregnancy complicated by obesity 04/29/2023   Carrier of spinal muscular atrophy 01/15/2022   Pre-existing type 2 diabetes mellitus during pregnancy, antepartum 05/31/2020    Prenatal Labs: ABO, Rh: --/--/AB POS (06/14 0524) Antibody: POS (06/14 0524) Rubella: Immune (01/30 0000)  RPR: NON REACTIVE (06/14 0524)  HBsAg: Negative (01/30 0000)  HIV: Non-reactive (01/30 0000)  GTT:  A1c 9.2 GBS: NEGATIVE/-- (06/14 1914)  GC/CHL: neg/neg Genetics: low-risk female  Vaccines: Tdap: declined Influenza: declined   OB History  Gravida Para Term Preterm AB Living  4 3 1 2  0 3  SAB IAB Ectopic Multiple Live Births  0     0 3    # Outcome Date GA Lbr Len/2nd Weight Sex Delivery Anes PTL Lv  4 Current           3 Preterm 09/12/20 [redacted]w[redacted]d 05:05 / 00:09 2390 g F Vag-Spont EPI  LIV  2 Term 08/01/09    F Vag-Spont   LIV  1 Preterm      Vag-Spont   LIV    Medical / Surgical History: Past medical history:  Past Medical  History:  Diagnosis Date   Anemia    Diabetes mellitus without complication (HCC)    PPH (postpartum hemorrhage) 01/15/2022   Preterm labor 01/15/2022   Shoulder dystocia during labor and delivery, delivered 01/15/2022    Past surgical history:  Past Surgical History:  Procedure Laterality Date   NO PAST SURGERIES     TOOTH EXTRACTION Bilateral 03/13/2021   Procedure: EXTRACTION MOLARS;  Surgeon: Ocie Doyne, DMD;  Location: [facility];  Service: Oral Surgery;  Laterality: Bilateral;   Family History:  Family History  Problem Relation Age of Onset   Diabetes Mother    Cancer Maternal Grandmother        Breast   Asthma Neg Hx    Heart disease Neg Hx    Hypertension Neg Hx     Social History:  reports that she quit smoking about 5 years ago. Her smoking use included cigarettes. She has never used smokeless tobacco. She reports that she does not currently use alcohol. She reports that she does not currently use drugs after having used the following drugs: Marijuana.  Allergies: Penicillins   Current Medications at time of admission:  Prior to Admission medications   Medication Sig Start Date End Date Taking? Authorizing Provider  Prenatal Vit-Fe Fumarate-FA (MULTIVITAMIN-PRENATAL) 27-0.8 MG TABS tablet Take 1 tablet by mouth  daily at 12 noon.    [provider]    Review of Systems: Constitutional: Negative   HENT: Negative   Eyes: Negative   Respiratory: Negative   Cardiovascular: Negative   Gastrointestinal: Negative  Genitourinary: neg for bloody show, neg for LOF   Musculoskeletal: Negative   Skin: Negative   Neurological: Negative   Endo/Heme/Allergies: Negative   Psychiatric/Behavioral: Negative    Physical Exam: VS: Blood pressure 102/63, pulse 76, temperature 98.2 F (36.8 C), temperature source Oral, resp. rate 16, height 5\' 5"  (1.651 m), weight 102.6 kg, last menstrual period 08/15/2022, unknown if currently breastfeeding. AAO x3,  no signs of distress Cardiovascular: RRR Respiratory: Unlabored GU/GI: Abdomen gravid, non-tender, non-distended, active FM, vertex, EFW 7+ per Leopold's Extremities: trace edema, negative for pain, tenderness, and cords  Cervical exam:Dilation: 4.5 Effacement (%): 70 Station: -1 Exam by:: Lizbeth Bark CNM FHR: baseline rate 140 / variability moderate / accelerations present / absent decelerations TOCO: 2-4 AROM - copious clear fluid   Prenatal Transfer Tool  Maternal Diabetes: Yes:  Diabetes Type:  Diet controlled Genetic Screening: Normal Maternal Ultrasounds/Referrals: Other:referred to MFM for hx of LGA and referred to [facility] for cardia anomalies. Normal fetal echo, recommended repeating fetal echo 2-3 months after birth Fetal Ultrasounds or other Referrals:  Fetal echo, Referred to Materal Fetal Medicine  Maternal Substance Abuse:  No Significant Maternal Medications:  None Significant Maternal Lab Results: Other:  A1c 9.2 Number of Prenatal Visits:greater than 3 verified prenatal visits Other Comments:   Hx of LGA and 4 min shoulder in 2022     Assessment: 32 y.o. [id number] [redacted]w[redacted]d IOL for Type 2 DM, polyhydramnios, hx of shoulder dystocia, and previous LGA neonate  Induction stage of labor FHR category 1 GBS negative Pain management plan: epidural   Plan:  Admit to L&D Routine admission orders Epidural PRN CBG Q 4 hrs Amniotomy Pitocin has been infusing 3 mU/min and increasing by 1 mU/min. Order modified to increase by 2  Diabetic consult PRN Dr Sallye Ober notified of admission, hx of shoulder dystocia, and plan of care  Roma Schanz DNP, CNM 05/08/2023 9:37 AM

## 2023-05-08 NOTE — Anesthesia Preprocedure Evaluation (Signed)
Anesthesia Evaluation  Patient identified by MRN, date of birth, ID band Patient awake    Reviewed: Allergy & Precautions, NPO status , Patient's Chart, lab work & pertinent test results  History of Anesthesia Complications Negative for: history of anesthetic complications  Airway Mallampati: III  TM Distance: >3 FB Neck ROM: Full    Dental   Pulmonary former smoker   Pulmonary exam normal breath sounds clear to auscultation       Cardiovascular negative cardio ROS  Rhythm:Regular Rate:Normal     Neuro/Psych negative neurological ROS     GI/Hepatic negative GI ROS, Neg liver ROS,,,  Endo/Other  diabetes, Type 2    Renal/GU negative Renal ROS     Musculoskeletal   Abdominal  (+) + obese  Peds  Hematology  (+) Blood dyscrasia, anemia   Anesthesia Other Findings   Reproductive/Obstetrics (+) Pregnancy                             Anesthesia Physical Anesthesia Plan  ASA: 2  Anesthesia Plan: Epidural   Post-op Pain Management:    Induction:   PONV Risk Score and Plan:   Airway Management Planned:   Additional Equipment:   Intra-op Plan:   Post-operative Plan:   Informed Consent: I have reviewed the patients History and Physical, chart, labs and discussed the procedure including the risks, benefits and alternatives for the proposed anesthesia with the patient or authorized representative who has indicated his/her understanding and acceptance.       Plan Discussed with: Anesthesiologist  Anesthesia Plan Comments: (I have discussed risks of neuraxial anesthesia including but not limited to infection, bleeding, nerve injury, back pain, headache, seizures, and failure of block. Patient denies bleeding disorders and is not currently anticoagulated. Labs have been reviewed. Risks and benefits discussed. All patient's questions answered.  )       Anesthesia Quick  Evaluation

## 2023-05-08 NOTE — Lactation Note (Signed)
This note was copied from a baby's chart. Lactation Consultation Note  Patient Name: [name] [name] [id number] Date: 05/08/2023 Age:31 hours  Mom chooses to formula feed.   Maternal Data    Feeding Nipple Type: Slow - flow  LATCH Score                    Lactation Tools Discussed/Used    Interventions    Discharge    Consult Status Consult Status: Complete    [name] [name] 05/08/2023, 7:44 PM

## 2023-05-09 ENCOUNTER — Encounter (HOSPITAL_COMMUNITY): Payer: Self-pay | Admitting: Obstetrics & Gynecology

## 2023-05-09 LAB — CBC
HCT: 37.3 % (ref 36.0–46.0)
Hemoglobin: 12.6 g/dL (ref 12.0–15.0)
MCH: 29.2 pg (ref 26.0–34.0)
MCHC: 33.8 g/dL (ref 30.0–36.0)
MCV: 86.3 fL (ref 80.0–100.0)
Platelets: 215 10*3/uL (ref 150–400)
RBC: 4.32 MIL/uL (ref 3.87–5.11)
RDW: 12.6 % (ref 11.5–15.5)
WBC: 8.4 10*3/uL (ref 4.0–10.5)
nRBC: 0 % (ref 0.0–0.2)

## 2023-05-09 LAB — GLUCOSE, CAPILLARY: Glucose-Capillary: 190 mg/dL — ABNORMAL HIGH (ref 70–99)

## 2023-05-09 NOTE — Progress Notes (Signed)
.  PPD# 1 SVD w/ intact perineum Information for the patient's newborn:  [name] [name], [name] [name] [[id number]]  female    S:   Reports feeling sore. States newborn has demonstrated full ROM of left arm and hand and has been gripping well with right hand. Tolerating PO fluid and solids No nausea or vomiting Bleeding is light, no clots Pain controlled with  PO meds Up ad lib / ambulatory / voiding w/o difficulty Feeding:  formula     O:   VS: BP 103/70 (BP Location: Right Arm)   Pulse 70   Temp 98.3 F (36.8 C) (Oral)   Resp 20   Ht 5\' 5"  (1.651 m)   Wt 102.6 kg   LMP 08/15/2022   SpO2 99%   Breastfeeding Unknown   BMI 37.63 kg/m   LABS:  Recent Labs    05/08/23 0524 05/09/23 0442  WBC 6.8 8.4  HGB 13.4 12.6  PLT 241 215   Blood type: --/--/AB POS (06/14 0524) Rubella: Immune (01/30 0000)                      I&O: Intake/Output      06/14 0701 06/15 0700   I.V. (mL/kg) 0 (0)   Other 0   Total Intake(mL/kg) 0 (0)   Urine (mL/kg/hr) 0 (0)   Blood 150   Total Output 150   Net -150       Urine Occurrence 2 x     Physical Exam: Alert and oriented X3 Lungs: Clear and unlabored Heart: regular rate and rhythm / no mumurs Abdomen: soft, non-tender, non-distended  Fundus: firm, non-tender, U-2 Perineum: intact Lochia: appropriate Extremities: trace edema, no calf pain, tenderness, or cords    A:  PPD # 1  Normal exam  P:  Routine post partum orders Tight-fitting bra to avoid engorgement Anticipate D/C on 05/10/23 [facility] Plan reviewed w/ Dr. Camillo Flaming, DNP, CNM 05/09/2023, 5:26 AM

## 2023-05-09 NOTE — Anesthesia Postprocedure Evaluation (Signed)
Anesthesia Post Note  Patient: [name] [name]  Procedure(s) Performed: AN AD HOC LABOR EPIDURAL     Patient location during evaluation: Mother Baby Anesthesia Type: Epidural Level of consciousness: awake and alert Pain management: pain level controlled Vital Signs Assessment: post-procedure vital signs reviewed and stable Respiratory status: spontaneous breathing, nonlabored ventilation and respiratory function stable Cardiovascular status: stable Postop Assessment: no headache, no backache and epidural receding Anesthetic complications: no   No notable events documented.  Last Vitals:  Vitals:   05/09/23 0125 05/09/23 0508  BP: 105/73 103/70  Pulse: (!) 58 70  Resp: 18 20  Temp: 36.9 C 36.8 C  SpO2: 99% 99%    Last Pain:  Vitals:   05/09/23 0508  TempSrc: Oral  PainSc: 4    Pain Goal:                   [name]

## 2023-05-09 NOTE — Progress Notes (Signed)
Pt reports had chips and gummies around 0200. Glucose 190.

## 2023-05-10 MED ORDER — IBUPROFEN 600 MG PO TABS: 600.0000 mg | ORAL_TABLET | Freq: Four times a day (QID) | ORAL | 0 refills | Status: AC

## 2023-05-10 NOTE — Discharge Instructions (Addendum)
[name] [name], 1. Do not do any heavy lifting, i.e nothing heavier than 30 lbs for the next 6 weeks.  2.  Do not use tampons or douche or take baths, do not have any sexual intercourse or anything inside the vagina for the next 6 weeks.  3. Take your pain medication as needed for pain, let us know if the pain is not well controlled despite pain medication use.  4.  If you get a fever while at home, do check your temperature and if it is equal to or greater than 100.4 please call the office.   5. Some vaginal bleeding is expected and normal after your delivery. Please let us know if if it excessive where you saturate 1 pad in less than 2 hours or so.  6. Please let us know if with depression or anxiety symptoms.  7. Please continue with diabetic diet to help control your sugar levels. Please check an early morning fasting finger stick sugar level once a day  and bring the numbers to review at you next office visit.   [facility] [phone].

## 2023-05-10 NOTE — Discharge Summary (Signed)
Postpartum Discharge Summary  Date of Service updated***     Patient Name: [name] [name] DOB: 1990-12-04 MRN: [id number]  Date of admission: 05/08/2023 Delivery date:05/08/2023  Delivering provider: Dorathy Kinsman  Date of discharge: 05/10/2023  Admitting diagnosis: IUGR (intrauterine growth restriction) affecting care of mother [O36.5990] Intrauterine pregnancy: [redacted]w[redacted]d     Secondary diagnosis:  Active Problems:   Shoulder dystocia during labor and delivery, delivered   Hx of shoulder dystocia in prior pregnancy, currently pregnant, third trimester  Additional problems: ***    Discharge diagnosis: {DX.:23714}                                              Post partum procedures:{Postpartum procedures:23558} Augmentation: {Augmentation:20782} Complications: {OB Labor/Delivery Complications:20784}  Hospital course: {Courses:23701}  Magnesium Sulfate received: {Mag received:30440022} BMZ received: {BMZ received:30440023} Rhophylac:{Rhophylac received:30440032} MVH:{QIO:96295284} T-DaP:{Tdap:23962} Flu: {XLK:44010} Transfusion:{Transfusion received:30440034}  Physical exam  Vitals:   05/09/23 0508 05/09/23 1458 05/09/23 2307 05/10/23 0553  BP: 103/70 111/73 100/71 105/69  Pulse: 70 78 63 70  Resp: 20  18 16   Temp: 98.3 F (36.8 C) 98.3 F (36.8 C) 98.1 F (36.7 C) 98.1 F (36.7 C)  TempSrc: Oral Oral Oral Oral  SpO2: 99% 96% 97% 99%  Weight:      Height:       General: {Exam; general:21111117} Lochia: {Desc; appropriate/inappropriate:30686::"appropriate"} Uterine Fundus: {Desc; firm/soft:30687} Incision: {Exam; incision:21111123} DVT Evaluation: {Exam; dvt:2111122} Labs: Lab Results  Component Value Date   WBC 8.4 05/09/2023   HGB 12.6 05/09/2023   HCT 37.3 05/09/2023   MCV 86.3 05/09/2023   PLT 215 05/09/2023      Latest Ref Rng & Units 05/08/2023    5:24 AM  CMP  Glucose 70 - 99 mg/dL 272   BUN 6 - 20 mg/dL 10   Creatinine 5.36 - 1.00 mg/dL  6.44   Sodium 034 - 742 mmol/L 135   Potassium 3.5 - 5.1 mmol/L 3.6   Chloride 98 - 111 mmol/L 106   CO2 22 - 32 mmol/L 19   Calcium 8.9 - 10.3 mg/dL 9.3   Total Protein 6.5 - 8.1 g/dL 6.1   Total Bilirubin 0.3 - 1.2 mg/dL 1.2   Alkaline Phos 38 - 126 U/L 91   AST 15 - 41 U/L 17   ALT 0 - 44 U/L 24    Edinburgh Score:    05/09/2023   12:21 PM  Edinburgh Postnatal Depression Scale Screening Tool  I have been able to laugh and see the funny side of things. 0  I have looked forward with enjoyment to things. 0  I have blamed myself unnecessarily when things went wrong. 0  I have been anxious or worried for no good reason. 0  I have felt scared or panicky for no good reason. 0  Things have been getting on top of me. 1  I have been so unhappy that I have had difficulty sleeping. 0  I have felt sad or miserable. 0  I have been so unhappy that I have been crying. 1  The thought of harming myself has occurred to me. 0  Edinburgh Postnatal Depression Scale Total 2     After visit meds:  Allergies as of 05/10/2023       Reactions   Penicillins Rash        Medication List  TAKE these medications    ibuprofen 600 MG tablet Commonly known as: ADVIL Take 1 tablet (600 mg total) by mouth every 6 (six) hours.   multivitamin-prenatal 27-0.8 MG Tabs tablet Take 1 tablet by mouth daily at 12 noon.         Discharge home in stable condition Infant Feeding: {Baby feeding:23562} Infant Disposition:{CHL IP OB HOME WITH ONGEXB:28413} Discharge instruction: per After Visit Summary and Postpartum booklet. Activity: Advance as tolerated. Pelvic rest for 6 weeks.  Diet: {OB KGMW:10272536} Future Appointments:No future appointments. Follow up Visit:   Please schedule this patient for a {Visit type:23955} postpartum visit in {Postpartum visit:23953} with the following provider: {Provider type:23954}. Additional Postpartum F/U:{PP Procedure:23957}  {Risk level:23960} pregnancy  complicated by: {complication:23959} Delivery mode:  Vaginal, Spontaneous  Anticipated Birth Control:  {Birth Control:23956}   05/10/2023 Prescilla Sours, MD

## 2023-05-11 LAB — BPAM RBC
Blood Product Expiration Date: 202407142359
Blood Product Expiration Date: 202407142359
Unit Type and Rh: 8400
Unit Type and Rh: 8400

## 2023-05-11 LAB — TYPE AND SCREEN

## 2023-05-26 ENCOUNTER — Telehealth (HOSPITAL_COMMUNITY): Payer: Self-pay

## 2023-05-26 NOTE — Telephone Encounter (Signed)
05/26/2023 1037  Name: [name] [name] MRN: [id number] DOB: August 21, 1991  Reason for Call:  Transition of Care Hospital Discharge Call  Contact Status: Patient Contact Status: Complete  Language assistant needed: Interpreter Mode: Interpreter Not Needed        Follow-Up Questions: Do You Have Any Concerns About Your Health As You Heal From Delivery?: Yes What Concerns Do You Have About Your Health?: Patient states that she has been doing pretty good. She does state that she has been having some mid to lower back pain. She reports that she had an epidural with her birth. RN reviewed that some soreness at the epidural site is common but should improve. Patient states that her OB gave her some stretches to do and that she has been doing those. She states that she is using heat packs as well for comfort. RN told patient to contact her OB-GYN if soreness does not improve. Patient states that she has an appointment with her OB-GYN next week. Patient declines any other questions or concerns about her healing. Do You Have Any Concerns About Your Infants Health?: No  Edinburgh Postnatal Depression Scale:  In the Past 7 Days: I have been able to laugh and see the funny side of things.: As much as I always could I have looked forward with enjoyment to things.: As much as I ever did I have blamed myself unnecessarily when things went wrong.: No, never I have been anxious or worried for no good reason.: Hardly ever I have felt scared or panicky for no good reason.: No, not at all Things have been getting on top of me.: No, I have been coping as well as ever I have been so unhappy that I have had difficulty sleeping.: Not at all I have felt sad or miserable.: No, not at all I have been so unhappy that I have been crying.: No, never The thought of harming myself has occurred to me.: Never [name] Postnatal Depression Scale Total: 1  PHQ2-9 Depression Scale:     Discharge Follow-up: Edinburgh  score requires follow up?: No Patient was advised of the following resources:: Support Group, Breastfeeding Support Group Patient referred to:: OB Did patient express any COVID concerns?: No  Post-discharge interventions: Reviewed Newborn Safe Sleep Practices  Signature Signe Colt

## 2023-06-04 IMAGING — US US MFM OB FOLLOW-UP
1 series · 13 of 28 positions shown · non-contrast
Comparison: none

[Series 1: us mfm ob follow-up · 71 acquisitions, 13 frames shown]
[im 3/71]
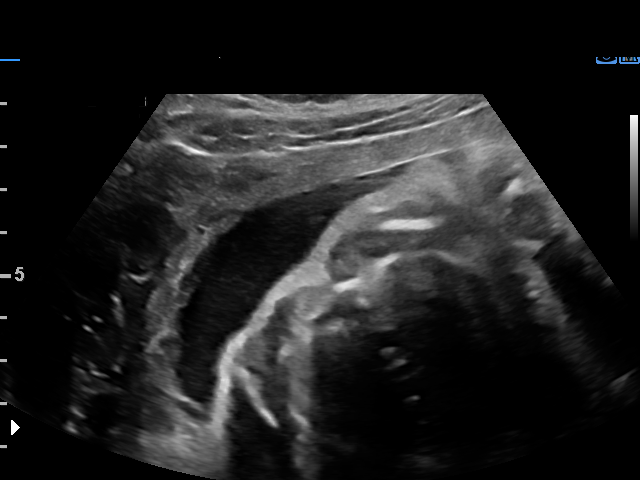
[im 8/71]
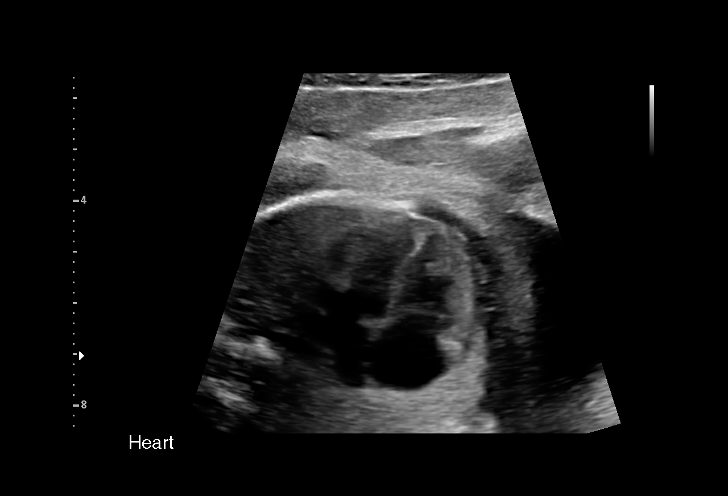
[im 13/71]
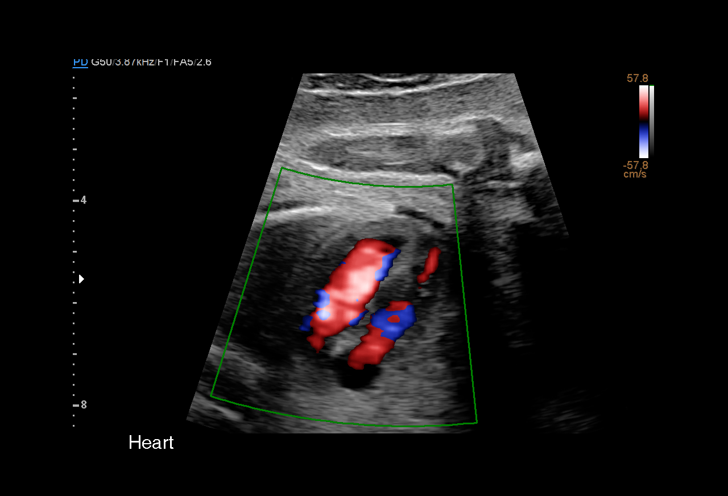
[im 19/71]
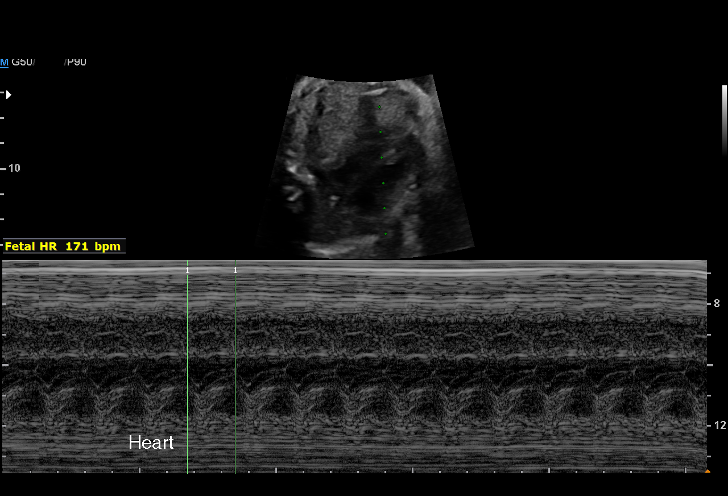
[im 24/71]
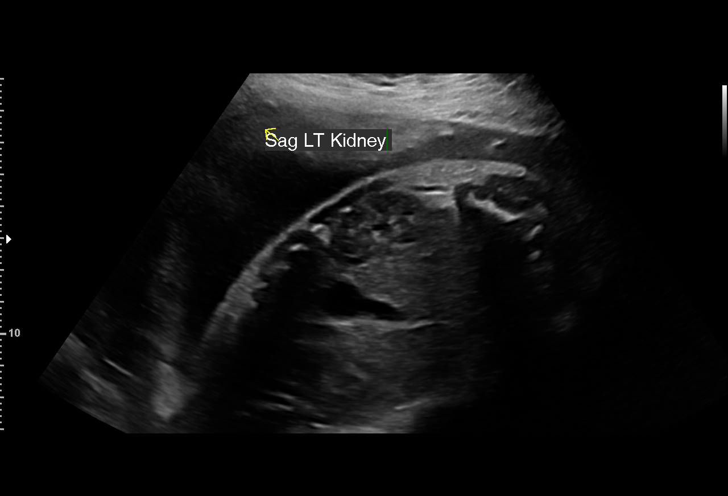
[im 29/71]
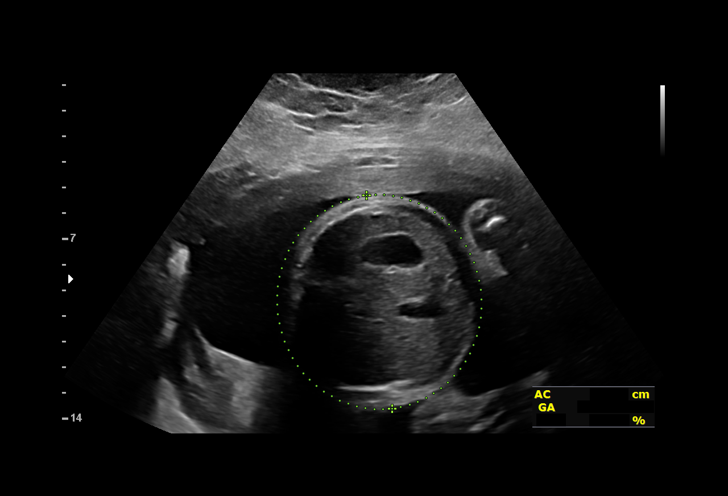
[im 37/71]
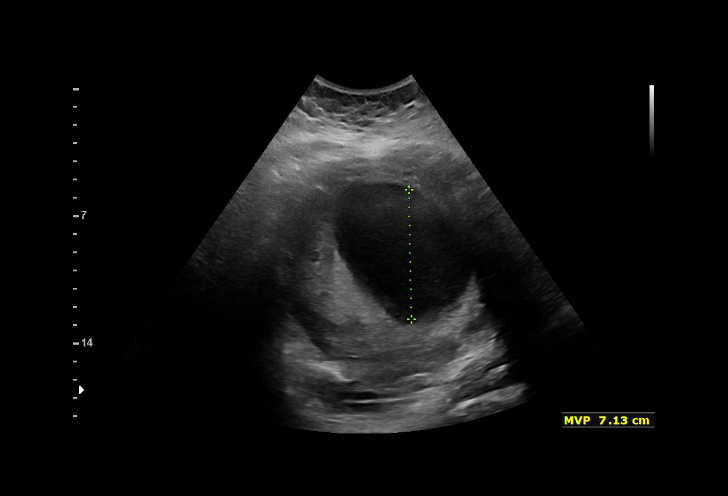
[im 42/71]
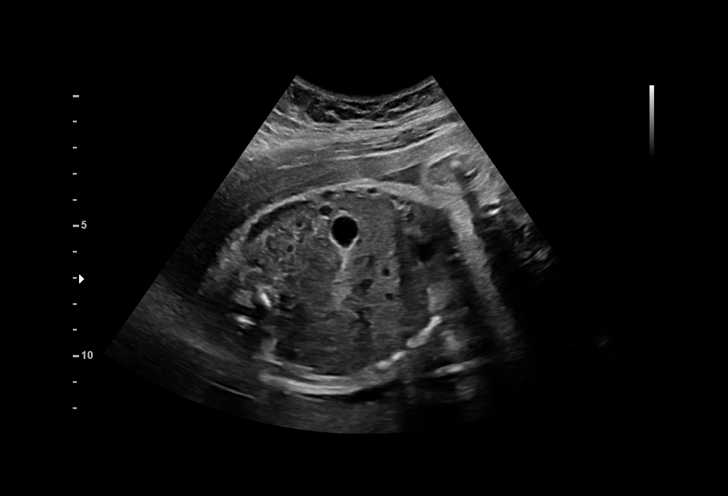
[im 47/71]
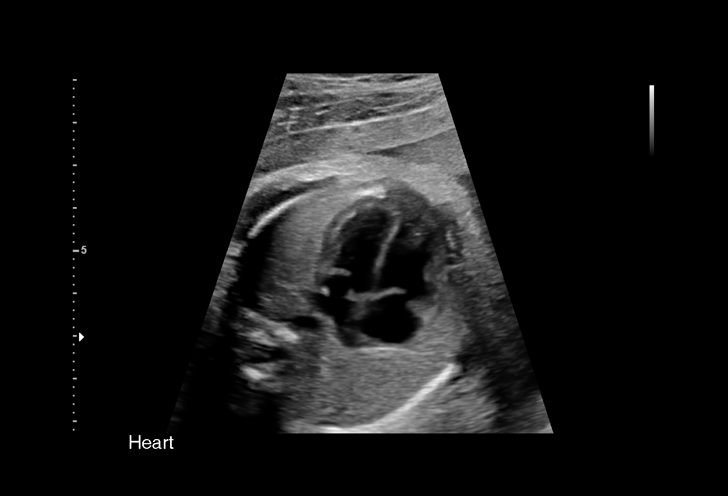
[im 52/71]
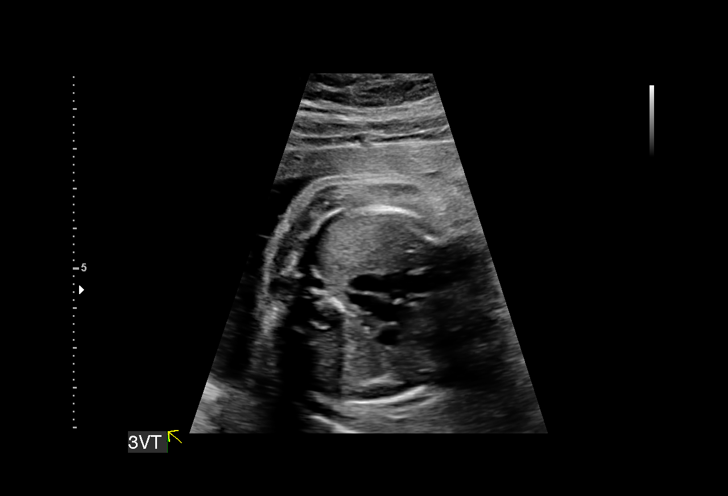
[im 58/71]
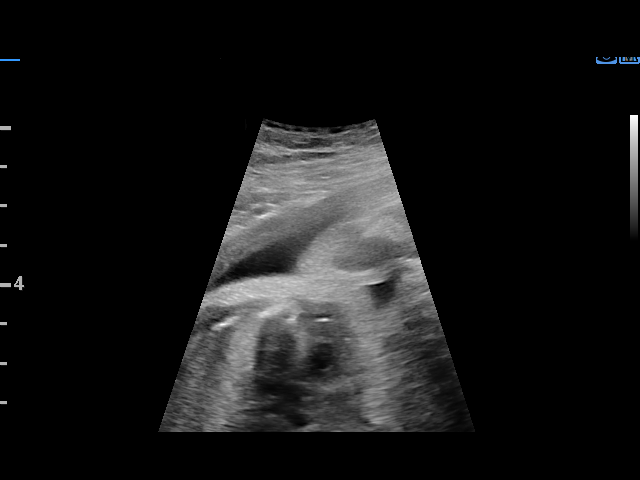
[im 63/71]
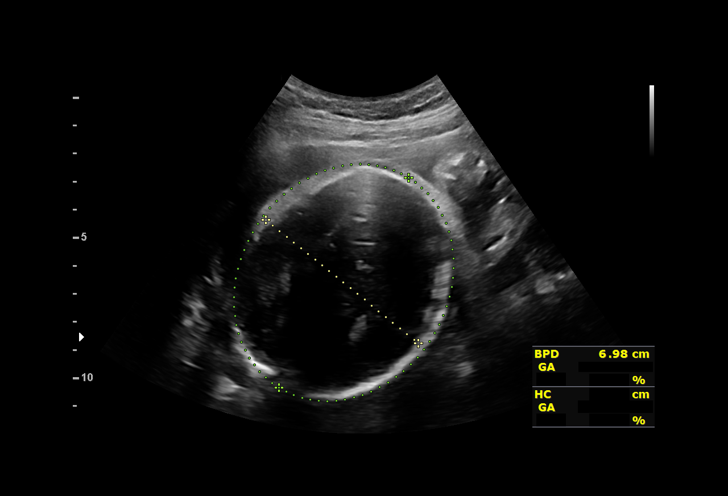
[im 68/71]
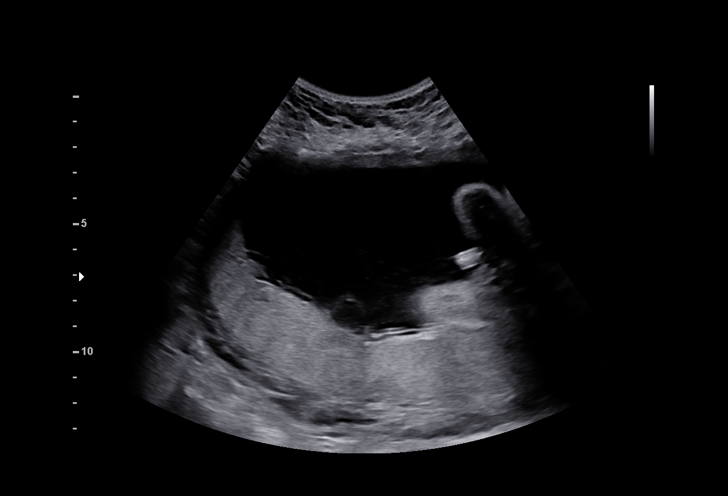

[13 of 28 positions shown; findings below may reference images not displayed]

Obstetrics &
                                                            Gynecology
                                                            [id number]
                                                            [place].
                   CNM

Indications

 Pre-existing diabetes, type 2, in pregnancy,
 second trimester
 Obesity complicating pregnancy, second
 trimester (pregravid BMI 35)
 Poor obstetric history: Previous preterm
 delivery, antepartum (33+2 weeks PROM)
 Genetic carrier (SMA carrier; partner
 screened negative)
 Low risk NIPS, neg AFP
 27 weeks gestation of pregnancy
 Encounter for other antenatal screening
 follow-up
Fetal Evaluation

 Num Of Fetuses:         1
 Fetal Heart Rate(bpm):  166
 Cardiac Activity:       Observed
 Presentation:           Cephalic
 Placenta:               Posterior
 P. Cord Insertion:      Previously Visualized
 Amniotic Fluid
 AFI FV:      Within normal limits

                             Largest Pocket(cm)

Biometry

 BPD:      66.9  mm     G. Age:  27w 0d         23  %    CI:         72.1   %    70 - 86
                                                         FL/HC:      21.1   %    18.6 -
 HC:      250.7  mm     G. Age:  27w 2d         16  %    HC/AC:      0.97        1.05 -
 AC:      259.2  mm     G. Age:  30w 1d         97  %    FL/BPD:     79.1   %    71 - 87
 FL:       52.9  mm     G. Age:  28w 1d         56  %    FL/AC:      20.4   %    20 - 24

 Est. FW:    4558  gm    2 lb 14 oz      90  %
OB History

 Gravidity:    3         Term:   1        Prem:   1        SAB:   0
 TOP:          0       Ectopic:  0        Living: 2
Gestational Age

 LMP:           27w 3d        Date:  05/12/21                 EDD:   02/16/22
 U/S Today:     28w 1d                                        EDD:   02/11/22
 Best:          27w 3d     Det. By:  LMP  (05/12/21)          EDD:   02/16/22
Anatomy

 Cranium:               Appears normal         Aortic Arch:            Previously seen
 Cavum:                 Previously seen        Ductal Arch:            Previously seen
 Ventricles:            Previously seen        Diaphragm:              Appears normal
 Choroid Plexus:        Previously seen        Stomach:                Appears normal, left
                                                                       sided
 Cerebellum:            Previously seen        Abdomen:                Previously seen
 Posterior Fossa:       Previously seen        Abdominal Wall:         Previously seen
 Nuchal Fold:           Previously seen        Cord Vessels:           Previously seen
 Face:                  Orbits and profile     Kidneys:                Appear normal
                        previously seen
 Lips:                  Previously seen        Bladder:                Appears normal
 Thoracic:              Previously seen        Spine:                  Previously seen
 Heart:                 Appears normal         Upper Extremities:      Previously seen
                        (4CH, axis, and
                        situs)
 RVOT:                  Previously seen        Lower Extremities:      Previously seen
 LVOT:                  Previously seen

 Other:  VC, 3VV and 3VTV,  Hands previously visualized.
Cervix Uterus Adnexa

 Cervix
 Not visualized (advanced GA >74wks)
Comments

 This patient was seen for a follow up growth scan due to
 pregestational diabetes that is treated with metformin.  She
 denies any problems since her last exam.  She had a normal
 fetal echocardiogram performed with [facility]
 [facility].
 She was informed that the fetal growth and amniotic fluid
 level appears appropriate for her gestational age.
 Due to pregestational diabetes, a follow-up growth scan and
 BPP were scheduled in 5 weeks.

## 2024-09-15 ENCOUNTER — Other Ambulatory Visit: Payer: Self-pay

## 2024-09-15 ENCOUNTER — Ambulatory Visit
Admission: EM | Admit: 2024-09-15 | Discharge: 2024-09-15 | Disposition: A | Discharge status: Home / Self Care | Attending: Family Medicine | Admitting: Family Medicine

## 2024-09-15 DIAGNOSIS — T7840XA Allergy, unspecified, initial encounter: Secondary | ICD-10-CM

## 2024-09-15 MED ORDER — PREDNISONE 50 MG PO TABS
ORAL_TABLET | ORAL | 0 refills | Status: AC
Start: 2024-09-16 — End: ?

## 2024-09-15 MED ORDER — METHYLPREDNISOLONE SODIUM SUCC 125 MG IJ SOLR
125.0000 mg | Freq: Once | INTRAMUSCULAR | Status: AC
  Administered 2024-09-15: 125 mg via INTRAMUSCULAR

## 2024-09-15 NOTE — ED Provider Notes (Signed)
 [facility]    CSN: [id number] Arrival date & time: 09/15/24  1250      History   Chief Complaint No chief complaint on file.   HPI [name] [name] is a 33 y.o. female presents for allergic reaction.  Patient reports 1 hour prior to arrival she was at work when she suddenly felt her lipswere tingling and that she had itching gums and then she felt like her throat was beginning to tighten.  She states she went across the street to a [employer] and got a generic Allegra and took that about 30 to 45 minutes ago.  States her symptoms have somewhat improved but she continues to feel some mild swelling throughout line and itching specially around her eyes.  Denies any shortness of breath or chest pain, lip swelling, difficulty swallowing or breathing.  Denies any new medications detergents.  She took 40 mg ibuprofen  around 8 AM this morning for menstrual cramps but she states she is taking that in the past.  No history of anaphylaxis in the past.  Other concerns at this time.  HPI  Past Medical History:  Diagnosis Date   Anemia    Diabetes mellitus without complication (HCC)    PPH (postpartum hemorrhage) 01/15/2022   Preterm labor 01/15/2022   Shoulder dystocia during labor and delivery, delivered 01/15/2022    Patient Active Problem List   Diagnosis Date Noted   Hx of shoulder dystocia in prior pregnancy, currently pregnant, third trimester 05/08/2023   Pregnancy complicated by obesity 04/29/2023   Carrier of spinal muscular atrophy 01/15/2022   Shoulder dystocia during labor and delivery, delivered 01/15/2022   Pre-existing type 2 diabetes mellitus during pregnancy, antepartum 05/31/2020    Past Surgical History:  Procedure Laterality Date   NO PAST SURGERIES     TOOTH EXTRACTION Bilateral 03/13/2021   Procedure: EXTRACTION MOLARS;  Surgeon: Sheryle Hamilton, DMD;  Location: [facility];  Service: Oral Surgery;  Laterality: Bilateral;    OB History      Gravida  4   Para  4   Term  2   Preterm  2   AB  0   Living  4      SAB  0   IAB      Ectopic      Multiple  0   Live Births  4            Home Medications    Prior to Admission medications   Medication Sig Start Date End Date Taking? Authorizing Provider  predniSONE  (DELTASONE ) 50 MG tablet Take 1 tablet daily for 5 days 09/16/24  Yes Brighton Delio, Jodi R, NP  ibuprofen  (ADVIL ) 600 MG tablet Take 1 tablet (600 mg total) by mouth every 6 (six) hours. 05/10/23   Gloriann Chick, MD  Prenatal Vit-Fe Fumarate-FA (MULTIVITAMIN-PRENATAL) 27-0.8 MG TABS tablet Take 1 tablet by mouth daily at 12 noon.    [provider]    Family History Family History  Problem Relation Age of Onset   Diabetes Mother    Cancer Maternal Grandmother        Breast   Asthma Neg Hx    Heart disease Neg Hx    Hypertension Neg Hx     Social History Social History   Tobacco Use   Smoking status: Former    Current packs/day: 0.00    Types: Cigarettes    Quit date: 09/02/2017    Years since quitting: 7.0   Smokeless  tobacco: Never  Vaping Use   Vaping status: Never Used  Substance Use Topics   Alcohol use: Not Currently    Comment: not while preg   Drug use: Not Currently    Types: Marijuana    Comment: last: months ago     Allergies   Penicillins   Review of Systems Review of Systems  HENT:         Lip tingling, itching eyes itching throat tightness     Physical Exam Triage Vital Signs ED Triage Vitals  Encounter Vitals Group     BP 09/15/24 1306 109/76     Girls Systolic BP Percentile --      Girls Diastolic BP Percentile --      Boys Systolic BP Percentile --      Boys Diastolic BP Percentile --      Pulse Rate 09/15/24 1306 81     Resp 09/15/24 1306 18     Temp 09/15/24 1306 98.4 F (36.9 C)     Temp Source 09/15/24 1306 Oral     SpO2 09/15/24 1306 96 %     Weight --      Height --      Head Circumference --      Peak Flow --      Pain Score  09/15/24 1304 0     Pain Loc --      Pain Education --      Exclude from Growth Chart --    No data found.  Updated Vital Signs BP 109/76   Pulse 81   Temp 98.4 F (36.9 C) (Oral)   Resp 18   LMP 09/14/2024   SpO2 96%   Breastfeeding No   Visual Acuity Right Eye Distance:   Left Eye Distance:   Bilateral Distance:    Right Eye Near:   Left Eye Near:    Bilateral Near:     Physical Exam Vitals and nursing note reviewed.  Constitutional:      General: She is not in acute distress.    Appearance: Normal appearance. She is not ill-appearing.  HENT:     Head: Normocephalic and atraumatic.     Comments: No facial swelling.    Mouth/Throat:     Mouth: Mucous membranes are moist. No angioedema.     Pharynx: Oropharynx is clear. Uvula midline. No pharyngeal swelling or uvula swelling.     Comments: Airway is patent with midline uvula.  Patent speaking in complete sentences without difficulty. Eyes:     Pupils: Pupils are equal, round, and reactive to light.  Cardiovascular:     Rate and Rhythm: Normal rate and regular rhythm.     Heart sounds: Normal heart sounds.  Pulmonary:     Effort: Pulmonary effort is normal. No respiratory distress.     Breath sounds: Normal breath sounds. No stridor. No wheezing, rhonchi or rales.  Skin:    General: Skin is warm and dry.  Neurological:     General: No focal deficit present.     Mental Status: She is alert and oriented to person, place, and time.  Psychiatric:        Mood and Affect: Mood normal.        Behavior: Behavior normal.      UC Treatments / Results  Labs (all labs ordered are listed, but only abnormal results are displayed) Labs Reviewed - No data to display  EKG   Radiology No results found.  Procedures Procedures (including critical  care time)  Medications Ordered in UC Medications  methylPREDNISolone  sodium succinate (SOLU-MEDROL ) 125 mg/2 mL injection 125 mg (125 mg Intramuscular Given 09/15/24  1320)    Initial Impression / Assessment and Plan / UC Course  I have reviewed the triage vital signs and the nursing notes.  Pertinent labs & imaging results that were available during my care of the patient were reviewed by me and considered in my medical decision making (see chart for details).     Reviewed exam and symptoms with patient.  Patient presenting with allergic reaction symptoms x 1 hour ago.  She did take generic Allegra 30 to 45 minutes ago with some improvement in symptoms.  Vital signs are stable, she is in no respiratory distress.  Patient was given IM Solu-Medrol  and monitored for 30 minutes after injection.  Patient reports near resolution of symptoms.  She continues to remain free of any respiratory distress.  Will have her continue allergy medicine daily for the next 5 days and will start prednisone  tomorrow, 10/24.  She was advised to go to the ER for any new or returning/worsening symptoms. Final Clinical Impressions(s) / UC Diagnoses   Final diagnoses:  Allergic reaction, initial encounter     Discharge Instructions      Continue your allergy medicine daily for the next 5 days.  Start prednisone  daily tomorrow, 10/24.  Please go to the ER if you have any worsening or new symptoms that occur.  I hope you feel better soon!     ED Prescriptions     Medication Sig Dispense Auth. Provider   predniSONE  (DELTASONE ) 50 MG tablet Take 1 tablet daily for 5 days 5 tablet Treshawn Allen, Jodi R, NP      PDMP not reviewed this encounter.   Loreda Myla SAUNDERS, NP 09/15/24 [phone]

## 2024-09-15 NOTE — Discharge Instructions (Addendum)
 Continue your allergy medicine daily for the next 5 days.  Start prednisone  daily tomorrow, 10/24.  Please go to the ER if you have any worsening or new symptoms that occur.  I hope you feel better soon!

## 2024-09-15 NOTE — ED Triage Notes (Signed)
 Pt states her lips were tingling and gums itching, dysphagia, eyes itching started at 1200. Pt states she hasn't taken any new meds or products. Pt states took an allergy relief pill 30 mins ago. Pt is able to maintain oral secretions. PT is eupneic. PT is able to speak in complete sentences
